# Patient Record
Sex: Female | Born: 1965 | Marital: Married | Smoking: Never smoker
Health system: Southern US, Community
[De-identification: ages and names within clinical notes are randomized; demographics above are authoritative.]

## PROBLEM LIST (undated history)

## (undated) DIAGNOSIS — I7300 Raynaud's syndrome without gangrene: Secondary | ICD-10-CM

## (undated) DIAGNOSIS — M349 Systemic sclerosis, unspecified: Secondary | ICD-10-CM

---

## 2013-03-03 ENCOUNTER — Emergency Department: Payer: Self-pay | Admitting: Emergency Medicine

## 2013-03-03 ENCOUNTER — Ambulatory Visit: Payer: Self-pay | Admitting: Family Medicine

## 2013-03-03 LAB — CBC WITH DIFFERENTIAL/PLATELET
Basophil #: 0.1 10*3/uL (ref 0.0–0.1)
Basophil %: 0.9 %
EOS PCT: 1.4 %
Eosinophil #: 0.1 10*3/uL (ref 0.0–0.7)
HCT: 19.8 % — AB (ref 35.0–47.0)
HGB: 5.7 g/dL — AB (ref 12.0–16.0)
LYMPHS ABS: 1.7 10*3/uL (ref 1.0–3.6)
Lymphocyte %: 21 %
MCH: 18.1 pg — ABNORMAL LOW (ref 26.0–34.0)
MCHC: 29 g/dL — AB (ref 32.0–36.0)
MCV: 63 fL — AB (ref 80–100)
Monocyte #: 1.1 x10 3/mm — ABNORMAL HIGH (ref 0.2–0.9)
Monocyte %: 13 %
Neutrophil #: 5.1 10*3/uL (ref 1.4–6.5)
Neutrophil %: 63.7 %
PLATELETS: 444 10*3/uL — AB (ref 150–440)
RBC: 3.16 10*6/uL — AB (ref 3.80–5.20)
RDW: 19.6 % — AB (ref 11.5–14.5)
WBC: 8.1 10*3/uL (ref 3.6–11.0)

## 2013-03-03 LAB — COMPREHENSIVE METABOLIC PANEL
ALBUMIN: 3.4 g/dL (ref 3.4–5.0)
ANION GAP: 9 (ref 7–16)
Alkaline Phosphatase: 66 U/L
BUN: 11 mg/dL (ref 7–18)
Bilirubin,Total: 0.2 mg/dL (ref 0.2–1.0)
CALCIUM: 9.1 mg/dL (ref 8.5–10.1)
CO2: 26 mmol/L (ref 21–32)
Chloride: 101 mmol/L (ref 98–107)
Creatinine: 0.72 mg/dL (ref 0.60–1.30)
EGFR (African American): >60
EGFR (Non-African Amer.): >60
GLUCOSE: 86 mg/dL (ref 65–99)
Osmolality: 271 (ref 275–301)
Potassium: 4.2 mmol/L (ref 3.5–5.1)
SGOT(AST): 17 U/L (ref 15–37)
SGPT (ALT): 18 U/L (ref 12–78)
Sodium: 136 mmol/L (ref 136–145)
Total Protein: 8.9 g/dL — ABNORMAL HIGH (ref 6.4–8.2)

## 2013-03-03 LAB — PROTIME-INR
INR: 1
INR: 1.1
Prothrombin Time: 13.5 secs (ref 11.5–14.7)
Prothrombin Time: 13.8 secs (ref 11.5–14.7)

## 2013-03-03 LAB — TROPONIN I: TROPONIN-I: 0.03 ng/mL

## 2013-03-03 LAB — APTT: ACTIVATED PTT: 28.3 s (ref 23.6–35.9)

## 2013-03-03 LAB — CK TOTAL AND CKMB (NOT AT ARMC)
CK, TOTAL: 49 U/L (ref 21–215)
CK-MB: <0.5 ng/mL — ABNORMAL LOW (ref 0.5–3.6)

## 2013-03-04 LAB — HEMOGLOBIN: HGB: 8 g/dL — ABNORMAL LOW (ref 12.0–16.0)

## 2013-03-04 LAB — HEMATOCRIT: HCT: 25.3 % — ABNORMAL LOW (ref 35.0–47.0)

## 2015-10-03 ENCOUNTER — Ambulatory Visit
Admission: RE | Admit: 2015-10-03 | Discharge: 2015-10-03 | Disposition: A | Discharge status: Home / Self Care | Payer: BLUE CROSS/BLUE SHIELD | Source: Ambulatory Visit | Attending: Family Medicine | Admitting: Family Medicine

## 2015-10-03 ENCOUNTER — Other Ambulatory Visit: Payer: Self-pay | Admitting: Family Medicine

## 2015-10-03 DIAGNOSIS — R0602 Shortness of breath: Secondary | ICD-10-CM

## 2015-10-03 DIAGNOSIS — J849 Interstitial pulmonary disease, unspecified: Secondary | ICD-10-CM | POA: Diagnosis not present

## 2015-10-05 ENCOUNTER — Other Ambulatory Visit: Payer: Self-pay | Admitting: Specialist

## 2015-10-05 DIAGNOSIS — M349 Systemic sclerosis, unspecified: Secondary | ICD-10-CM

## 2015-10-05 DIAGNOSIS — R05 Cough: Secondary | ICD-10-CM

## 2015-10-05 DIAGNOSIS — R059 Cough, unspecified: Secondary | ICD-10-CM

## 2015-10-05 DIAGNOSIS — J8410 Pulmonary fibrosis, unspecified: Secondary | ICD-10-CM

## 2015-10-19 ENCOUNTER — Ambulatory Visit: Payer: BLUE CROSS/BLUE SHIELD

## 2015-10-21 ENCOUNTER — Encounter: Payer: Self-pay | Admitting: Radiology

## 2015-10-21 ENCOUNTER — Ambulatory Visit
Admission: RE | Admit: 2015-10-21 | Discharge: 2015-10-21 | Disposition: A | Discharge status: Home / Self Care | Payer: BLUE CROSS/BLUE SHIELD | Source: Ambulatory Visit | Attending: Specialist | Admitting: Specialist

## 2015-10-21 DIAGNOSIS — R05 Cough: Secondary | ICD-10-CM

## 2015-10-21 DIAGNOSIS — M3481 Systemic sclerosis with lung involvement: Secondary | ICD-10-CM | POA: Insufficient documentation

## 2015-10-21 DIAGNOSIS — M349 Systemic sclerosis, unspecified: Secondary | ICD-10-CM | POA: Diagnosis present

## 2015-10-21 DIAGNOSIS — J8410 Pulmonary fibrosis, unspecified: Secondary | ICD-10-CM | POA: Insufficient documentation

## 2015-10-21 DIAGNOSIS — R059 Cough, unspecified: Secondary | ICD-10-CM

## 2017-12-23 IMAGING — CR DG CHEST 2V
2 series · 2 of 2 positions shown · non-contrast
Comparison: 03/03/2013.

CLINICAL DATA: Malaise for the past 2 weeks. Shortness of breath.
Dry cough. Fatigue. Bilateral upper and lower chest pain. History of
cystic fibrosis, scleroderma and pneumonia.

EXAM:
CHEST  2 VIEW

[chest pa]
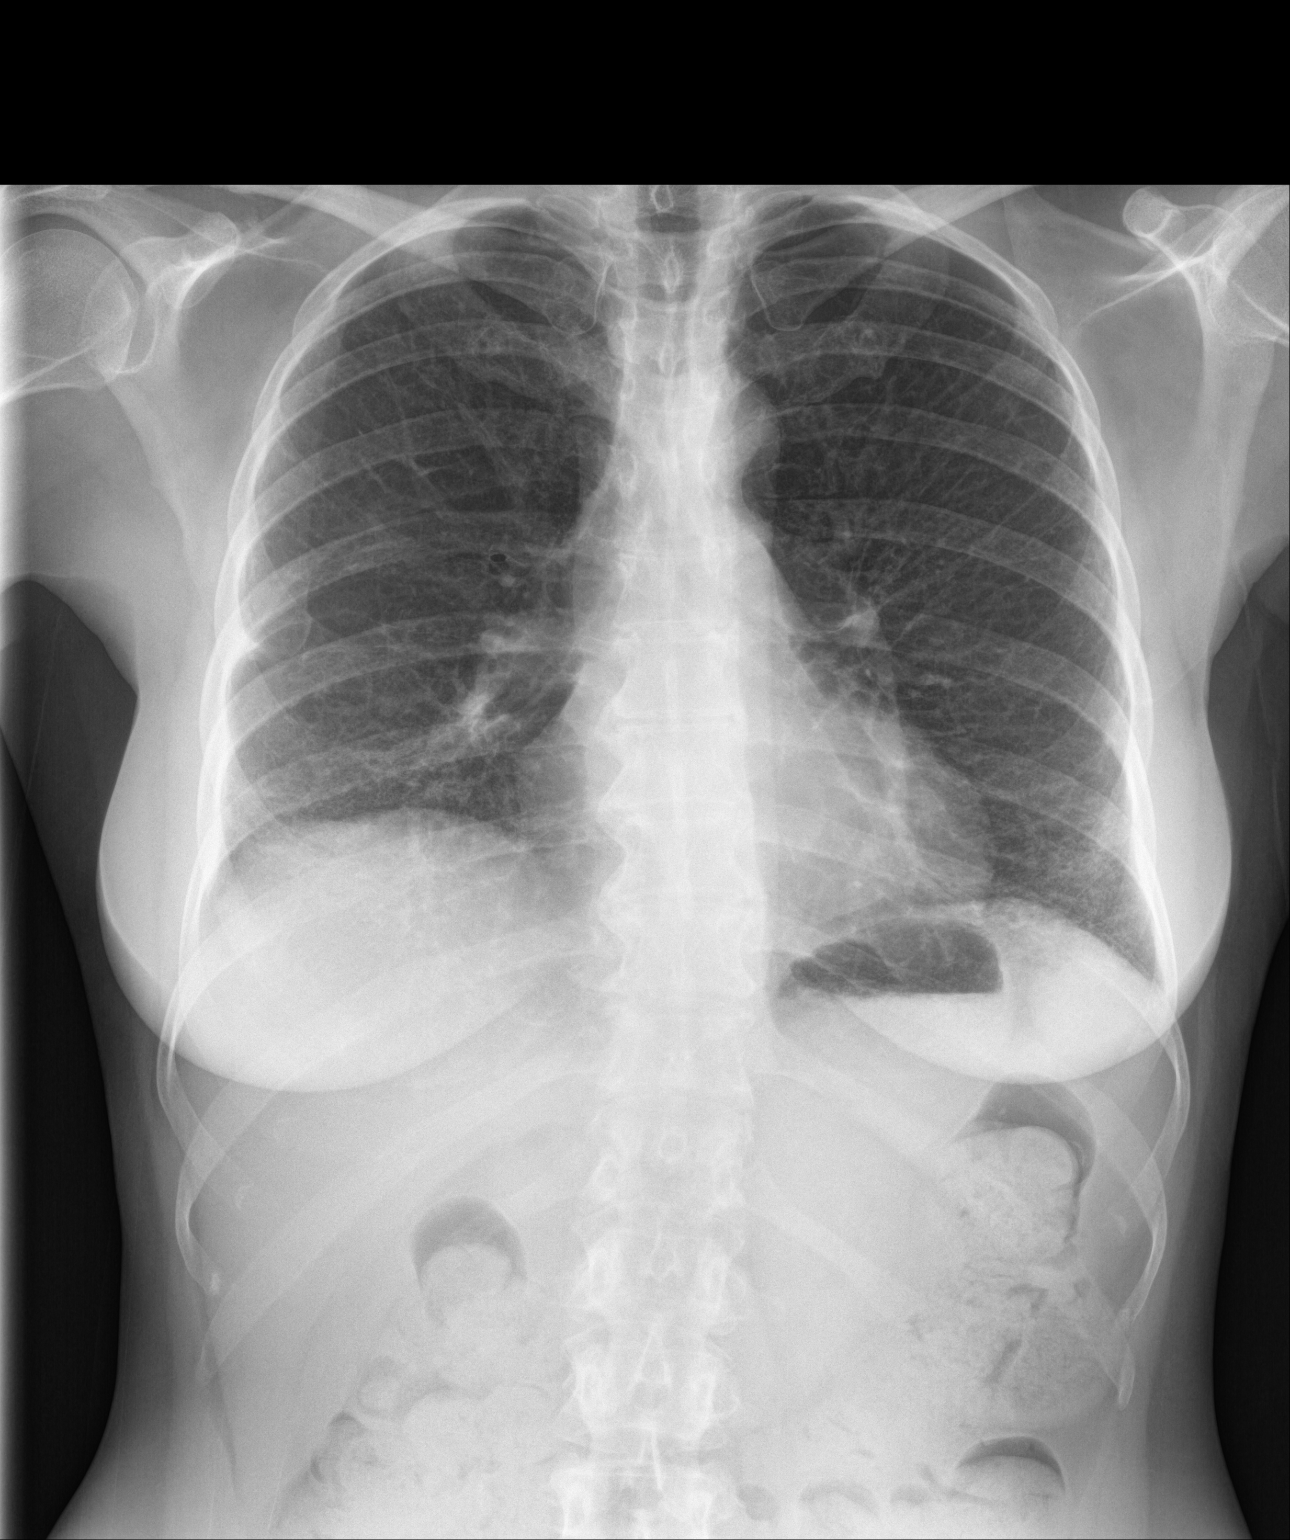

[chest lat]
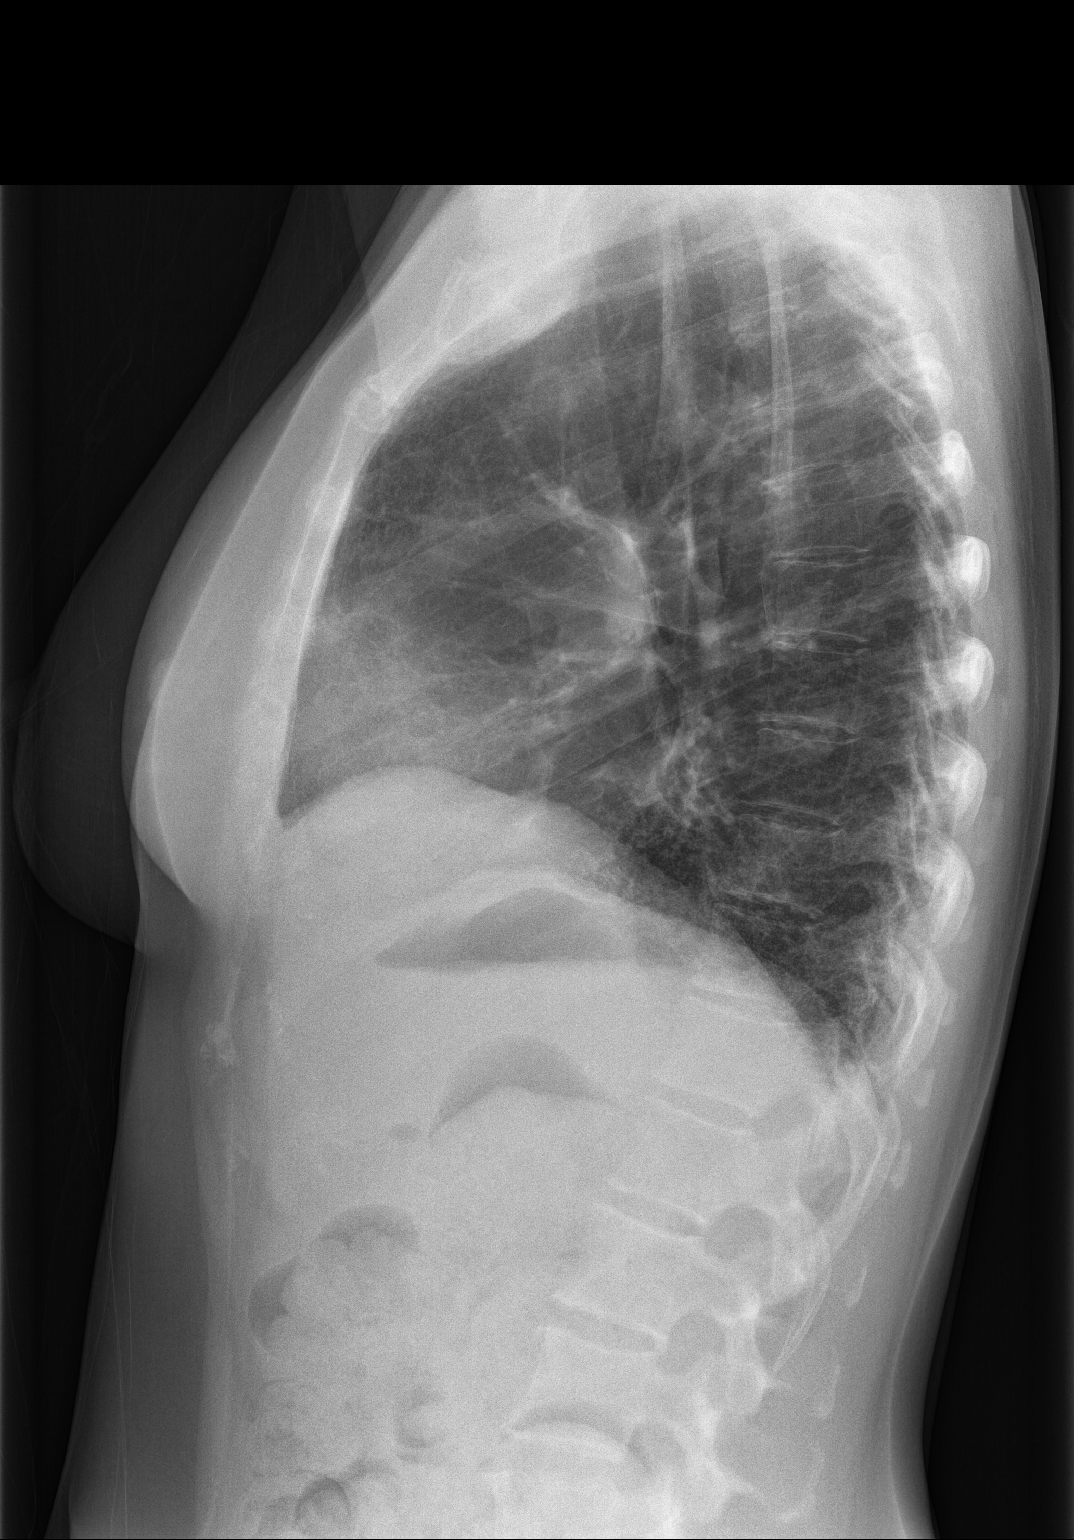

[2 of 2 positions shown; findings below may reference images not displayed]

FINDINGS: Normal sized heart. The interstitial markings remain prominent,
specially at the lung bases. Mild thoracic and lumbar spine
degenerative changes and mild scoliosis.
IMPRESSION: Stable chronic interstitial lung disease.  No acute abnormality.

## 2021-03-29 ENCOUNTER — Ambulatory Visit
Admission: RE | Admit: 2021-03-29 | Payer: BLUE CROSS/BLUE SHIELD | Source: Home / Self Care | Admitting: General Surgery

## 2021-03-29 ENCOUNTER — Encounter: Admission: RE | Payer: Self-pay | Source: Home / Self Care

## 2021-03-29 SURGERY — COLONOSCOPY WITH PROPOFOL
Anesthesia: General

## 2022-07-07 ENCOUNTER — Encounter: Payer: Self-pay | Admitting: Emergency Medicine

## 2022-07-07 ENCOUNTER — Other Ambulatory Visit: Payer: Self-pay

## 2022-07-07 ENCOUNTER — Inpatient Hospital Stay
Admission: EM | Admit: 2022-07-07 | Discharge: 2022-07-17 | DRG: 392 | Disposition: A | Discharge status: Home / Self Care | Payer: BC Managed Care – PPO | Attending: Internal Medicine | Admitting: Internal Medicine

## 2022-07-07 ENCOUNTER — Emergency Department: Payer: BC Managed Care – PPO

## 2022-07-07 DIAGNOSIS — R109 Unspecified abdominal pain: Principal | ICD-10-CM

## 2022-07-07 DIAGNOSIS — J8410 Pulmonary fibrosis, unspecified: Secondary | ICD-10-CM | POA: Diagnosis present

## 2022-07-07 DIAGNOSIS — Z6821 Body mass index (BMI) 21.0-21.9, adult: Secondary | ICD-10-CM | POA: Diagnosis not present

## 2022-07-07 DIAGNOSIS — M349 Systemic sclerosis, unspecified: Secondary | ICD-10-CM

## 2022-07-07 DIAGNOSIS — Z79899 Other long term (current) drug therapy: Secondary | ICD-10-CM

## 2022-07-07 DIAGNOSIS — E440 Moderate protein-calorie malnutrition: Secondary | ICD-10-CM | POA: Diagnosis present

## 2022-07-07 DIAGNOSIS — K5900 Constipation, unspecified: Principal | ICD-10-CM | POA: Diagnosis present

## 2022-07-07 DIAGNOSIS — K219 Gastro-esophageal reflux disease without esophagitis: Secondary | ICD-10-CM | POA: Diagnosis present

## 2022-07-07 DIAGNOSIS — I7300 Raynaud's syndrome without gangrene: Secondary | ICD-10-CM | POA: Diagnosis present

## 2022-07-07 DIAGNOSIS — K5902 Outlet dysfunction constipation: Secondary | ICD-10-CM | POA: Diagnosis not present

## 2022-07-07 HISTORY — DX: Raynaud's syndrome without gangrene: I73.00

## 2022-07-07 HISTORY — DX: Systemic sclerosis, unspecified: M34.9

## 2022-07-07 LAB — LIPASE, BLOOD: Lipase: 33 U/L (ref 11–51)

## 2022-07-07 LAB — COMPREHENSIVE METABOLIC PANEL
ALT: 13 U/L (ref 0–44)
AST: 22 U/L (ref 15–41)
Albumin: 3.6 g/dL (ref 3.5–5.0)
Alkaline Phosphatase: 51 U/L (ref 38–126)
Anion gap: 10 (ref 5–15)
BUN: 15 mg/dL (ref 6–20)
CO2: 25 mmol/L (ref 22–32)
Calcium: 9.1 mg/dL (ref 8.9–10.3)
Chloride: 97 mmol/L — ABNORMAL LOW (ref 98–111)
Creatinine, Ser: 0.59 mg/dL (ref 0.44–1.00)
GFR, Estimated: >60 mL/min (ref >60)
Glucose, Bld: 103 mg/dL — ABNORMAL HIGH (ref 70–99)
Potassium: 4.4 mmol/L (ref 3.5–5.1)
Sodium: 132 mmol/L — ABNORMAL LOW (ref 135–145)
Total Bilirubin: 0.5 mg/dL (ref 0.3–1.2)
Total Protein: 8.7 g/dL — ABNORMAL HIGH (ref 6.5–8.1)

## 2022-07-07 LAB — CBC
HCT: 44.6 % (ref 36.0–46.0)
Hemoglobin: 13.5 g/dL (ref 12.0–15.0)
MCH: 25.9 pg — ABNORMAL LOW (ref 26.0–34.0)
MCHC: 30.3 g/dL (ref 30.0–36.0)
MCV: 85.6 fL (ref 80.0–100.0)
Platelets: 454 10*3/uL — ABNORMAL HIGH (ref 150–400)
RBC: 5.21 MIL/uL — ABNORMAL HIGH (ref 3.87–5.11)
RDW: 15.6 % — ABNORMAL HIGH (ref 11.5–15.5)
WBC: 9.5 10*3/uL (ref 4.0–10.5)
nRBC: 0 % (ref 0.0–0.2)

## 2022-07-07 LAB — URINALYSIS, ROUTINE W REFLEX MICROSCOPIC
Bacteria, UA: NONE SEEN
Bilirubin Urine: NEGATIVE
Glucose, UA: NEGATIVE mg/dL
Ketones, ur: 40 mg/dL — AB
Leukocytes,Ua: NEGATIVE
Nitrite: NEGATIVE
Protein, ur: NEGATIVE mg/dL
Specific Gravity, Urine: <1.005 — ABNORMAL LOW (ref 1.005–1.030)
pH: 5.5 (ref 5.0–8.0)

## 2022-07-07 LAB — PREGNANCY, URINE: Preg Test, Ur: NEGATIVE

## 2022-07-07 MED ORDER — FLEET ENEMA 7-19 GM/118ML RE ENEM
1.0000 | ENEMA | Freq: Two times a day (BID) | RECTAL | Status: DC
  Administered 2022-07-07 – 2022-07-15 (×15): 1 via RECTAL

## 2022-07-07 MED ORDER — SODIUM CHLORIDE 0.9 % IV BOLUS
1000.0000 mL | Freq: Once | INTRAVENOUS | Status: AC
  Administered 2022-07-07: 1000 mL via INTRAVENOUS

## 2022-07-07 MED ORDER — GLYCERIN (LAXATIVE) 2 G RE SUPP
1.0000 | Freq: Every day | RECTAL | Status: DC
  Administered 2022-07-07 – 2022-07-11 (×5): 1 via RECTAL
  Filled 2022-07-07 (×5): qty 1

## 2022-07-07 MED ORDER — IOHEXOL 300 MG/ML  SOLN
100.0000 mL | Freq: Once | INTRAMUSCULAR | Status: AC | PRN
  Administered 2022-07-07: 80 mL via INTRAVENOUS

## 2022-07-07 MED ORDER — ONDANSETRON HCL 4 MG PO TABS: 4.0000 mg | ORAL_TABLET | ORAL | Status: DC | PRN

## 2022-07-07 MED ORDER — PANTOPRAZOLE SODIUM 40 MG IV SOLR
40.0000 mg | INTRAVENOUS | Status: DC
  Administered 2022-07-07 – 2022-07-08 (×2): 40 mg via INTRAVENOUS
  Filled 2022-07-07 (×2): qty 10

## 2022-07-07 MED ORDER — SODIUM CHLORIDE 0.9 % IV SOLN: INTRAVENOUS | Status: DC

## 2022-07-07 MED ORDER — MINERAL OIL RE ENEM
1.0000 | ENEMA | Freq: Every day | RECTAL | Status: DC
  Administered 2022-07-07 – 2022-07-10 (×4): 1 via RECTAL

## 2022-07-07 MED ORDER — ENOXAPARIN SODIUM 40 MG/0.4ML IJ SOSY
40.0000 mg | PREFILLED_SYRINGE | INTRAMUSCULAR | Status: DC
  Administered 2022-07-07 – 2022-07-15 (×8): 40 mg via SUBCUTANEOUS
  Filled 2022-07-07 (×7): qty 0.4

## 2022-07-07 MED ORDER — ONDANSETRON HCL 4 MG/2ML IJ SOLN: 4.0000 mg | Freq: Four times a day (QID) | INTRAMUSCULAR | Status: DC | PRN

## 2022-07-07 MED ORDER — PEG 3350-KCL-NA BICARB-NACL 420 G PO SOLR
4000.0000 mL | Freq: Once | ORAL | Status: AC
  Administered 2022-07-07: 4000 mL via ORAL
  Filled 2022-07-07: qty 4000

## 2022-07-07 MED ORDER — ONDANSETRON HCL 4 MG PO TABS: 4.0000 mg | ORAL_TABLET | Freq: Four times a day (QID) | ORAL | Status: DC | PRN

## 2022-07-07 MED ORDER — GLYCERIN (LAXATIVE) 1 G RE SUPP: 1.0000 | Freq: Every day | RECTAL | Status: DC

## 2022-07-07 MED ORDER — BISACODYL 10 MG RE SUPP
10.0000 mg | Freq: Every day | RECTAL | Status: DC
  Administered 2022-07-08 – 2022-07-11 (×4): 10 mg via RECTAL
  Filled 2022-07-07 (×5): qty 1

## 2022-07-07 MED ORDER — ONDANSETRON HCL 4 MG/2ML IJ SOLN
4.0000 mg | Freq: Four times a day (QID) | INTRAMUSCULAR | Status: DC | PRN
  Administered 2022-07-08 – 2022-07-13 (×3): 4 mg via INTRAVENOUS
  Filled 2022-07-07 (×3): qty 2

## 2022-07-07 NOTE — H&P (Addendum)
History and Physical    Patient: Jessica Stephenson ZOX:[id number] DOB: 1965/09/01 DOA: 07/07/2022 DOS: the patient was seen and examined on 07/07/2022 PCP: Dione Housekeeper, MD  Patient coming from: Home  Chief Complaint:  Chief Complaint  Patient presents with   Abdominal Pain   Nausea   HPI: Jessica Stephenson is a 57 y.o. female with medical history significant of scleroderma, GERD, pulmonary fibrosis, Raynaud's phenomenon presenting with severe constipation.  Patient with a baseline history of scleroderma.  Followed by [facility] rheumatology.  Patient ports recurring history of abdominal pain and constipation in the past.  No admission back in 2019 in [facility] system for similar issues.  Patient reports being started on Myfortic in addition to Plaquenil for scleroderma.  Patient reports significant worsening constipation since starting the medication.  Was using laxatives with minimal improvement in symptoms.  Has not had a significant bowel movement in multiple weeks.  Positive inability tolerate p.o. intake.  Has stopped taking medications entirely.  No chest pain.  Significant abdominal distention and bloating.  No shortness of breath.  No wheezing. Presented to the ER afebrile, heart rate 100s, BP stable, satting well on room air.  White count 9.5, hemoglobin 13.5, platelets 454, creatinine 0.6.  CT abdomen pelvis with lower esophageal circumferential wall thickening consistent with scleroderma as well as marked constipation throughout the colon in the setting of dysmotility and chronic interstitial lung disease. Review of Systems: As mentioned in the history of present illness. All other systems reviewed and are negative. Past Medical History:  Diagnosis Date   Raynaud disease    Scleroderma (HCC)   Social History:  has no history on file for tobacco use, alcohol use, and drug use.  Not on File  No family history on file.  Prior to Admission medications   Medication  Sig Start Date End Date Taking? Authorizing Provider  lansoprazole (PREVACID) 30 MG capsule Take 30 mg by mouth 2 (two) times daily before a meal. 01/23/22 07/22/22 Yes [provider]    Physical Exam: Vitals:   07/07/22 1100 07/07/22 1115 07/07/22 1130 07/07/22 1145  BP:      Pulse: 93 97 (!) 112 (!) 102  Resp:      Temp:      TempSrc:      SpO2: 95% 98% 100% 97%  Weight:      Height:       Physical Exam Constitutional:      Comments: Underweight  HENT:     Head: Normocephalic and atraumatic.     Mouth/Throat:     Mouth: Mucous membranes are dry.  Eyes:     Pupils: Pupils are equal, round, and reactive to light.  Cardiovascular:     Rate and Rhythm: Normal rate and regular rhythm.  Pulmonary:     Effort: Pulmonary effort is normal.  Abdominal:     Comments: Positive generalized abdominal distention Mild decreased bowel sounds  Musculoskeletal:        General: Normal range of motion.     Cervical back: Normal range of motion.  Skin:    General: Skin is dry.  Neurological:     General: No focal deficit present.  Psychiatric:        Mood and Affect: Mood normal.     Data Reviewed:  There are no new results to review at this time. CT ABDOMEN PELVIS W CONTRAST CLINICAL DATA:  Bowel obstruction suspected. Reported clinical history of scleroderma.  EXAM: CT ABDOMEN  AND PELVIS WITH CONTRAST  TECHNIQUE: Multidetector CT imaging of the abdomen and pelvis was performed using the standard protocol following bolus administration of intravenous contrast.  RADIATION DOSE REDUCTION: This exam was performed according to the departmental dose-optimization program which includes automated exposure control, adjustment of the mA and/or kV according to patient size and/or use of iterative reconstruction technique.  CONTRAST:  80mL OMNIPAQUE IOHEXOL 300 MG/ML  SOLN  COMPARISON:  None Available.  FINDINGS: Lower chest: Bronchiectasis with chronic peripheral  interstitial lung disease noted in both lung bases. Features are similar to minimally progressive since CT chest of 10/21/2015.  Hepatobiliary: No suspicious focal abnormality within the liver parenchyma. There is no evidence for gallstones, gallbladder wall thickening, or pericholecystic fluid. No intrahepatic or extrahepatic biliary dilation.  Pancreas: No focal mass lesion. No dilatation of the main duct. No intraparenchymal cyst. No peripancreatic edema.  Spleen: No splenomegaly. No focal mass lesion.  Adrenals/Urinary Tract: No adrenal nodule or mass. Kidneys unremarkable. No evidence for hydroureter. The urinary bladder appears normal for the degree of distention.  Stomach/Bowel: Distal esophagus is patulous with circumferential wall thickening. Stomach is unremarkable. No gastric wall thickening. No evidence of outlet obstruction. Duodenum is normally positioned as is the ligament of Treitz. Small bowel loops in the abdomen and pelvis are fluid-filled and distended measuring on the order of 2.523.4 cm diameter. The cecum is low in the pelvis. Neither the terminal ileum nor the appendix can be discretely visualized due to the prominent diffuse small bowel and colonic distension. Cecum is dilated up to 7.2 cm diameter. Ascending colon and transverse colon are packed with fluid and stool with transverse colon measuring on the order of 4.9 cm diameter. Formed stool is visible in the splenic flexure, descending colon, and within the long redundant sigmoid colon. The distal sigmoid colon and rectum is largely free of stool. Colon is noted to be diffusely featureless with absence of haustra folds. No evidence for pneumatosis. No portal venous gas.  Vascular/Lymphatic: No abdominal aortic aneurysm. No abdominal aortic atherosclerotic calcification. Portal vein, superior mesenteric vein, and splenic vein are patent. There is no gastrohepatic or hepatoduodenal ligament  lymphadenopathy. No retroperitoneal or mesenteric lymphadenopathy. No pelvic sidewall lymphadenopathy.  Reproductive: The uterus is unremarkable.  There is no adnexal mass.  Other: No intraperitoneal free fluid.  Musculoskeletal: No worrisome lytic or sclerotic osseous abnormality.  IMPRESSION: 1. Diffusely patulous lower esophagus with circumferential wall thickening. Imaging features compatible with the reported clinical history of scleroderma. 2. There is diffuse fluid-filled small bowel distension without appreciable wall thickening or pneumatosis. Colon is markedly distended throughout with formed stool seen in the splenic flexure and extending to the level of the distal sigmoid segment. Colon is also noted to be featureless without evidence for hospital fold markings. No wall thickening or pneumatosis in the colon. No distal obstructing colonic mass lesion evident by CT. General appearance is compatible with marked constipation and may be related to underlying dysmotility. 3. Features of underlying chronic interstitial lung disease.  Electronically Signed   By: Kennith Center M.D.   On: 07/07/2022 10:29  Lab Results  Component Value Date   WBC 9.5 07/07/2022   HGB 13.5 07/07/2022   HCT 44.6 07/07/2022   MCV 85.6 07/07/2022   PLT 454 (H) 07/07/2022   Last metabolic panel Lab Results  Component Value Date   GLUCOSE 103 (H) 07/07/2022   NA 132 (L) 07/07/2022   K 4.4 07/07/2022   CL 97 (L) 07/07/2022  CO2 25 07/07/2022   BUN 15 07/07/2022   CREATININE 0.59 07/07/2022   GFRNONAA >60 07/07/2022   CALCIUM 9.1 07/07/2022   PROT 8.7 (H) 07/07/2022   ALBUMIN 3.6 07/07/2022   BILITOT 0.5 07/07/2022   ALKPHOS 51 07/07/2022   AST 22 07/07/2022   ALT 13 07/07/2022   ANIONGAP 10 07/07/2022    Assessment and Plan: * Constipation Worsening constipation in setting of baseline constipation w/ scleroderma assd dysmotility  Noted marked constipation w/ dysmotility on CT   Per Dr. Larinda Buttery, case discussed w/ Dr. Mia Creek Will start w/ lower bowel regimen w/ enemas Follow up recommendations w/ gastroenterology   GERD (gastroesophageal reflux disease) PPI  Pulmonary fibrosis (HCC) Baseline pulmonary fibrosis in the setting of scleroderma Stable from respiratory standpoint Follow  Scleroderma (HCC) Baseline scleroderma previous on Plaquenil and Myfortic Followed by [facility] in the setting of severe constipation Monitor for now       Advance Care Planning:   Code Status: Full Code   Consults: Dr. Mia Creek w/ gastroenterology   Family Communication: Family at the bedside   Severity of Illness: The appropriate patient status for this patient is INPATIENT. Inpatient status is judged to be reasonable and necessary in order to provide the required intensity of service to ensure the patient's safety. The patient's presenting symptoms, physical exam findings, and initial radiographic and laboratory data in the context of their chronic comorbidities is felt to place them at high risk for further clinical deterioration. Furthermore, it is not anticipated that the patient will be medically stable for discharge from the hospital within 2 midnights of admission.   * I certify that at the point of admission it is my clinical judgment that the patient will require inpatient hospital care spanning beyond 2 midnights from the point of admission due to high intensity of service, high risk for further deterioration and high frequency of surveillance required.*  Author: Floydene Flock, MD 07/07/2022 12:48 PM  For on call review [email or web address].

## 2022-07-07 NOTE — ED Provider Notes (Signed)
[facility] Provider Note    Event Date/Time   First MD Initiated Contact with Patient 07/07/22 [id number]     (approximate)  History   Chief Complaint: Abdominal Pain and Nausea  HPI  Jessica Stephenson is a 57 y.o. female with a past medical history of scleroderma, Raynaud's, presents to the emergency department for constipation nausea and abdominal distention.  According to the patient in November she was started on a new medication for her scleroderma.  Since that time she states she has been experiencing episodes initially of diarrhea now with constipation.  Patient states she has tried laxatives at home with very very small bowel movements per patient.  States she is nauseated having difficulty eating which she relates to the abdominal distention.  States it has been approximately 6 weeks since she had a "normal" bowel movement.  Patient denies any pain in the abdomen just that it feels tight and distended.  Occasional vomiting after eating per patient.  Physical Exam   Triage Vital Signs: ED Triage Vitals  Enc Vitals Group     BP 07/07/22 0802 (P) 106/82     Pulse Rate 07/07/22 0802 (!) (P) 142     Resp 07/07/22 0802 (P) 20     Temp 07/07/22 0800 98.2 F (36.8 C)     Temp Source 07/07/22 0800 Oral     SpO2 07/07/22 0802 (P) 100 %     Weight 07/07/22 0800 115 lb (52.2 kg)     Height 07/07/22 0800 5\' 2"  (1.575 m)     Head Circumference --      Peak Flow --      Pain Score 07/07/22 0800 6     Pain Loc --      Pain Edu? --      Excl. in GC? --     Most recent vital signs: Vitals:   07/07/22 0825 07/07/22 0826  BP:    Pulse: (!) 108 (!) 114  Resp:    Temp:    SpO2: 99% 99%    General: Awake, no distress.  Chronically ill-appearing/cachectic. CV:  Good peripheral perfusion.  Regular rate and rhythm around 100 bpm. Resp:  Normal effort.  Equal breath sounds bilaterally.  Abd:  No distention.  Moderate abdominal distention although  nontender to palpation.  Dull percussion.  ED Results / Procedures / Treatments   EKG  EKG viewed and interpreted by myself shows sinus tachycardia 130 bpm with a narrow QRS, normal axis, normal intervals, nonspecific ST changes without ST elevation.  RADIOLOGY  I have reviewed and interpreted the CT images.  Patient has significant constipation with obstructive characteristics with fluid-filled small bowel all the way up to the stomach. Radiology is read the CT scan as wall thickening of the distal esophagus, fluid-filled small bowel loops with significant constipation.   MEDICATIONS ORDERED IN ED: Medications - No data to display   IMPRESSION / MDM / ASSESSMENT AND PLAN / ED COURSE  I reviewed the triage vital signs and the nursing notes.  Patient's presentation is most consistent with acute presentation with potential threat to life or bodily function.  Patient presents emergency department for constipation nausea and abdominal distention.  I reviewed the patient's chart from [facility] she has scleroderma patient recently started on Myfortic, which could be the cause of the patient's GI issues.  She was referred to GI, states she is currently attempting to see Dr. Tobi Bastos of GI but has been unsuccessful so far  and does not have a current GI physician.  Patient states she has tried laxatives at home she has tried an enema at home without relief so she came to the emergency department.  Here the patient does appear chronically ill/cachectic but with a distended abdomen nontender but dull percussion.  Given these findings we will proceed with labs to evaluate for metabolic/electrolyte abnormalities, obtain CT imaging the abdomen/pelvis and continue to closely monitor.  Patient agreeable to plan of care.  CT scan shows very significant constipation with fluid-filled small bowel loops all the way to the stomach, concerning for very significant constipation, and decreased motility.  Reassuringly  patient's CBC is normal, chemistry shows no significant findings and lipase is normal.  Given the CT findings and patient's abdominal distention and discomfort I spoke to Dr. Mia Creek of GI who recommends enemas until the patient is able to tolerate something orally.  Will admit to the hospitalist.  FINAL CLINICAL IMPRESSION(S) / ED DIAGNOSES   Abdominal distention Constipation  Note:  This document was prepared using Dragon voice recognition software and may include unintentional dictation errors.   Minna Antis, MD 07/07/22 1130

## 2022-07-07 NOTE — Assessment & Plan Note (Signed)
Baseline scleroderma previous on Plaquenil and Myfortic Followed by [facility] rheumatology [facility] in the setting of severe constipation Monitor for now

## 2022-07-07 NOTE — Assessment & Plan Note (Addendum)
Worsening constipation in setting of baseline constipation  Noted marked constipation w/ dysmotility on CT  Per Dr. Larinda Buttery, case discussed w/ Dr. Mia Creek Will start w/ lower bowel regimen w/ enemas Follow up recommendations w/ gastroenterology

## 2022-07-07 NOTE — Assessment & Plan Note (Signed)
Baseline pulmonary fibrosis in the setting of scleroderma Stable from respiratory standpoint Follow

## 2022-07-07 NOTE — Assessment & Plan Note (Signed)
PPI ?

## 2022-07-07 NOTE — Consult Note (Signed)
Consultation  Referring Provider: ED      Admit date: 07/07/2022 Consult date: 07/07/2022         Reason for Consultation: Constipation              HPI:   Jessica Stephenson is a 57 y.o. lady with history of scleroderma, pulmonary fibrosis, and raynaud's who is here for severe constipation as evidenced by stool on CT. Has had a similar episode a few years ago. Patient attributes it to her myfortic but on brief chart review it seems she has a long history of constipation likely due to scleroderma. Her last good bowel movement was over a month ago. She has had poor PO intake. Has nausea even with liquids. She tried some over the counter enemas without success.    Past Medical History:  Diagnosis Date   Raynaud disease    Scleroderma (HCC)      No family history on file.  Family history of alcohol abuse in her father     Prior to Admission medications   Medication Sig Start Date End Date Taking? Authorizing Provider  hydroxychloroquine (PLAQUENIL) 200 MG tablet Take 200 mg by mouth daily. 01/23/22  Yes [provider]  lansoprazole (PREVACID) 30 MG capsule Take 30 mg by mouth 2 (two) times daily before a meal. 01/23/22 07/22/22 Yes [provider]  meloxicam (MOBIC) 15 MG tablet Take 15 mg by mouth daily as needed for pain. 01/23/22  Yes [provider]  mycophenolate (MYFORTIC) 360 MG TBEC EC tablet Take 720 mg by mouth 2 (two) times daily.   Yes [provider]  sildenafil (REVATIO) 20 MG tablet Take 20 mg by mouth 2 (two) times daily. 01/23/22 01/23/23 Yes [provider]    Current Facility-Administered Medications  Medication Dose Route Frequency Provider Last Rate Last Admin   0.9 %  sodium chloride infusion   Intravenous Continuous Floydene Flock, MD 100 mL/hr at 07/07/22 1452 New Bag at 07/07/22 1452   [START ON 07/08/2022] bisacodyl (DULCOLAX) suppository 10 mg  10 mg Rectal Daily Regis Bill, MD       enoxaparin  (LOVENOX) injection 40 mg  40 mg Subcutaneous Q24H Floydene Flock, MD   40 mg at 07/07/22 1452   glycerin (Pediatric) 1 g suppository 1 g  1 suppository Rectal Daily Regis Bill, MD       mineral oil enema 1 enema  1 enema Rectal Daily Floydene Flock, MD   1 enema at 07/07/22 1454   ondansetron (ZOFRAN) tablet 4 mg  4 mg Oral Q4H PRN Regis Bill, MD       Or   ondansetron (ZOFRAN) injection 4 mg  4 mg Intravenous Q6H PRN Regis Bill, MD       pantoprazole (PROTONIX) injection 40 mg  40 mg Intravenous Q24H Jayd Forrey, Rossie Muskrat, MD       polyethylene glycol-electrolytes (NuLYTELY) solution 4,000 mL  4,000 mL Oral Once Regis Bill, MD       sodium phosphate (FLEET) 7-19 GM/118ML enema 1 enema  1 enema Rectal BID Floydene Flock, MD   1 enema at 07/07/22 1512    Allergies as of 07/07/2022   (No Known Allergies)     Review of Systems:    All systems reviewed and negative except where noted in HPI.  Review of Systems  Constitutional:  Negative for chills and fever.  Respiratory:  Positive for shortness of breath.  Cardiovascular:  Negative for chest pain.  Gastrointestinal:  Positive for abdominal pain, constipation, nausea and vomiting.  Musculoskeletal:  Positive for joint pain.  Skin:  Negative for rash.  Neurological:  Negative for focal weakness.  Psychiatric/Behavioral:  Negative for substance abuse.   All other systems reviewed and are negative.      Physical Exam:  Vital signs in last 24 hours: Temp:  [97.8 F (36.6 C)-98.4 F (36.9 C)] 97.8 F (36.6 C) (05/11 1723) Pulse Rate:  [43-142] 43 (05/11 1723) Resp:  [18-20] 18 (05/11 1723) BP: (106-124)/(69-77) 121/71 (05/11 1723) SpO2:  [62 %-100 %] 62 % (05/11 1723) Weight:  [52.2 kg] 52.2 kg (05/11 0800)   General:   Cachetic appearing Head:  Normocephalic and atraumatic. Eyes:   No icterus.   Conjunctiva pink. Mouth: Dry mucous membranes Neck:  Supple; no masses felt Lungs:  No  respiratory distress Heart:  S1S2, RRR, no MRG. No edema. Abdomen:   very distended Msk:  tight skin Neurologic:  Alert and  oriented x4;  no focal deficits Skin:  Warm, dry, pink without significant lesions or rashes. Psych:  Alert and cooperative. Normal affect.  LAB RESULTS: Recent Labs    07/07/22 0905  WBC 9.5  HGB 13.5  HCT 44.6  PLT 454*   BMET Recent Labs    07/07/22 0905  NA 132*  K 4.4  CL 97*  CO2 25  GLUCOSE 103*  BUN 15  CREATININE 0.59  CALCIUM 9.1   LFT Recent Labs    07/07/22 0905  PROT 8.7*  ALBUMIN 3.6  AST 22  ALT 13  ALKPHOS 51  BILITOT 0.5   PT/INR No results for input(s): "LABPROT", "INR" in the last 72 hours.  STUDIES: CT ABDOMEN PELVIS W CONTRAST  Result Date: 07/07/2022 CLINICAL DATA:  Bowel obstruction suspected. Reported clinical history of scleroderma. EXAM: CT ABDOMEN AND PELVIS WITH CONTRAST TECHNIQUE: Multidetector CT imaging of the abdomen and pelvis was performed using the standard protocol following bolus administration of intravenous contrast. RADIATION DOSE REDUCTION: This exam was performed according to the departmental dose-optimization program which includes automated exposure control, adjustment of the mA and/or kV according to patient size and/or use of iterative reconstruction technique. CONTRAST:  80mL OMNIPAQUE IOHEXOL 300 MG/ML  SOLN COMPARISON:  None Available. FINDINGS: Lower chest: Bronchiectasis with chronic peripheral interstitial lung disease noted in both lung bases. Features are similar to minimally progressive since CT chest of 10/21/2015. Hepatobiliary: No suspicious focal abnormality within the liver parenchyma. There is no evidence for gallstones, gallbladder wall thickening, or pericholecystic fluid. No intrahepatic or extrahepatic biliary dilation. Pancreas: No focal mass lesion. No dilatation of the main duct. No intraparenchymal cyst. No peripancreatic edema. Spleen: No splenomegaly. No focal mass lesion.  Adrenals/Urinary Tract: No adrenal nodule or mass. Kidneys unremarkable. No evidence for hydroureter. The urinary bladder appears normal for the degree of distention. Stomach/Bowel: Distal esophagus is patulous with circumferential wall thickening. Stomach is unremarkable. No gastric wall thickening. No evidence of outlet obstruction. Duodenum is normally positioned as is the ligament of Treitz. Small bowel loops in the abdomen and pelvis are fluid-filled and distended measuring on the order of 2.523.4 cm diameter. The cecum is low in the pelvis. Neither the terminal ileum nor the appendix can be discretely visualized due to the prominent diffuse small bowel and colonic distension. Cecum is dilated up to 7.2 cm diameter. Ascending colon and transverse colon are packed with fluid and stool with transverse colon measuring on the order  of 4.9 cm diameter. Formed stool is visible in the splenic flexure, descending colon, and within the long redundant sigmoid colon. The distal sigmoid colon and rectum is largely free of stool. Colon is noted to be diffusely featureless with absence of haustra folds. No evidence for pneumatosis. No portal venous gas. Vascular/Lymphatic: No abdominal aortic aneurysm. No abdominal aortic atherosclerotic calcification. Portal vein, superior mesenteric vein, and splenic vein are patent. There is no gastrohepatic or hepatoduodenal ligament lymphadenopathy. No retroperitoneal or mesenteric lymphadenopathy. No pelvic sidewall lymphadenopathy. Reproductive: The uterus is unremarkable.  There is no adnexal mass. Other: No intraperitoneal free fluid. Musculoskeletal: No worrisome lytic or sclerotic osseous abnormality. IMPRESSION: 1. Diffusely patulous lower esophagus with circumferential wall thickening. Imaging features compatible with the reported clinical history of scleroderma. 2. There is diffuse fluid-filled small bowel distension without appreciable wall thickening or pneumatosis. Colon is  markedly distended throughout with formed stool seen in the splenic flexure and extending to the level of the distal sigmoid segment. Colon is also noted to be featureless without evidence for hospital fold markings. No wall thickening or pneumatosis in the colon. No distal obstructing colonic mass lesion evident by CT. General appearance is compatible with marked constipation and may be related to underlying dysmotility. 3. Features of underlying chronic interstitial lung disease. Electronically Signed   By: Kennith Center M.D.   On: 07/07/2022 10:29       Impression / Plan:   57 y/o lady with history of scleroderma, pulmonary fibrosis, and raynaud's who is here for severe constipation as evidenced by stool on CT  - will need to focus on enema's and suppositories from below until things are moving somewhat - will order golytely and instructed her to only takes a few sips as she can tolerate it, if she feels she has too much nausea then she does not need to drink it - increased her fluids a bit - ordered IV PPI given severe GERD likely from scleroderma - will continue to follow, please call with any questions or concerns  Merlyn Lot MD, MPH [facility]

## 2022-07-07 NOTE — Assessment & Plan Note (Signed)
Worsening constipation in setting of baseline constipation w/ scleroderma associated dysmotility  Noted marked constipation w/ dysmotility on CT  GI was consulted and they were recommending lower bowel regimen with enemas and try taking GoLytely if able to tolerate-patient has an history of developing nausea with that in the past.  Still no success -Continue with your bowel regimen with enemas and encourage to take GoLytely. -Might need physical disimpaction

## 2022-07-07 NOTE — ED Notes (Addendum)
EDP into room, at BS. Pt alert, NAD, calm, interactive, resps e/u.  ?

## 2022-07-08 ENCOUNTER — Encounter: Payer: Self-pay | Admitting: Family Medicine

## 2022-07-08 DIAGNOSIS — M349 Systemic sclerosis, unspecified: Secondary | ICD-10-CM | POA: Diagnosis not present

## 2022-07-08 DIAGNOSIS — K5900 Constipation, unspecified: Secondary | ICD-10-CM | POA: Diagnosis not present

## 2022-07-08 DIAGNOSIS — K219 Gastro-esophageal reflux disease without esophagitis: Secondary | ICD-10-CM | POA: Diagnosis not present

## 2022-07-08 DIAGNOSIS — J8410 Pulmonary fibrosis, unspecified: Secondary | ICD-10-CM | POA: Diagnosis not present

## 2022-07-08 LAB — HIV ANTIBODY (ROUTINE TESTING W REFLEX): HIV Screen 4th Generation wRfx: NONREACTIVE

## 2022-07-08 LAB — CBC
HCT: 33.1 % — ABNORMAL LOW (ref 36.0–46.0)
Hemoglobin: 10.2 g/dL — ABNORMAL LOW (ref 12.0–15.0)
MCH: 26.2 pg (ref 26.0–34.0)
MCHC: 30.8 g/dL (ref 30.0–36.0)
MCV: 85.1 fL (ref 80.0–100.0)
Platelets: 396 10*3/uL (ref 150–400)
RBC: 3.89 MIL/uL (ref 3.87–5.11)
RDW: 15.7 % — ABNORMAL HIGH (ref 11.5–15.5)
WBC: 8 10*3/uL (ref 4.0–10.5)
nRBC: 0 % (ref 0.0–0.2)

## 2022-07-08 LAB — COMPREHENSIVE METABOLIC PANEL
ALT: 11 U/L (ref 0–44)
AST: 16 U/L (ref 15–41)
Albumin: 2.9 g/dL — ABNORMAL LOW (ref 3.5–5.0)
Alkaline Phosphatase: 40 U/L (ref 38–126)
Anion gap: 6 (ref 5–15)
BUN: 11 mg/dL (ref 6–20)
CO2: 23 mmol/L (ref 22–32)
Calcium: 8.3 mg/dL — ABNORMAL LOW (ref 8.9–10.3)
Chloride: 107 mmol/L (ref 98–111)
Creatinine, Ser: 0.38 mg/dL — ABNORMAL LOW (ref 0.44–1.00)
GFR, Estimated: >60 mL/min (ref >60)
Glucose, Bld: 78 mg/dL (ref 70–99)
Potassium: 3.7 mmol/L (ref 3.5–5.1)
Sodium: 136 mmol/L (ref 135–145)
Total Bilirubin: 0.7 mg/dL (ref 0.3–1.2)
Total Protein: 6.6 g/dL (ref 6.5–8.1)

## 2022-07-08 MED ORDER — PANTOPRAZOLE SODIUM 40 MG IV SOLR
40.0000 mg | Freq: Two times a day (BID) | INTRAVENOUS | Status: DC
  Administered 2022-07-09 – 2022-07-14 (×12): 40 mg via INTRAVENOUS
  Filled 2022-07-08 (×12): qty 10

## 2022-07-08 NOTE — Hospital Course (Addendum)
Taken from H&P.  Jessica Stephenson is a 57 y.o. female with medical history significant of scleroderma, GERD, pulmonary fibrosis, Raynaud's phenomenon presenting with severe constipation as evident on CT abdomen and pelvis.  Patient has a longstanding history of constipation most likely secondary to scleroderma.  She was recently started on Myfortic in addition to Plaquenil for scleroderma . She was also experiencing excessive nausea limiting her p.o. intake.  Last good bowel movement was about a month ago, tried over-the-counter enemas with no success.  On presentation she was hemodynamically stable.  Labs seems stable. CT abdomen pelvis with lower esophageal circumferential wall thickening consistent with scleroderma as well as marked constipation throughout the colon in the setting of dysmotility and chronic interstitial lung disease.   GI was also consulted.  They are recommending enemas and suppositories until things started moving.  They also order GoLytely with instructions to only take few sips at a time if she can tolerate it.  5/12: Vital stable.  Received multiple enemas and suppository without any success.  Unable to tolerate GoLytely-encouraged to take small sips at a time.  Might need physical disimpaction.  5/13: Still no significant bowel movement.  Encouraging drinking GoLytely.  GI is on board

## 2022-07-08 NOTE — Progress Notes (Signed)
Progress Note   Patient: Jessica Stephenson ZOX:[id number] DOB: Feb 27, 1965 DOA: 07/07/2022     1 DOS: the patient was seen and examined on 07/08/2022   Brief hospital course: Taken from H&P.  Jessica Stephenson is a 57 y.o. female with medical history significant of scleroderma, GERD, pulmonary fibrosis, Raynaud's phenomenon presenting with severe constipation as evident on CT abdomen and pelvis.  Patient has a longstanding history of constipation most likely secondary to scleroderma.  She was recently started on Myfortic in addition to Plaquenil for scleroderma . She was also experiencing excessive nausea limiting her p.o. intake.  Last good bowel movement was about a month ago, tried over-the-counter enemas with no success.  On presentation she was hemodynamically stable.  Labs seems stable. CT abdomen pelvis with lower esophageal circumferential wall thickening consistent with scleroderma as well as marked constipation throughout the colon in the setting of dysmotility and chronic interstitial lung disease.   GI was also consulted.  They are recommending enemas and suppositories until things started moving.  They also order GoLytely with instructions to only take few sips at a time if she can tolerate it.  5/12: Vital stable.  Received multiple enemas and suppository without any success.  Unable to tolerate GoLytely-encouraged to take small sips at a time.  Might need physical disimpaction.     Assessment and Plan: * Constipation Worsening constipation in setting of baseline constipation w/ scleroderma associated dysmotility  Noted marked constipation w/ dysmotility on CT  GI was consulted and they were recommending lower bowel regimen with enemas and try taking GoLytely if able to tolerate-patient has an history of developing nausea with that in the past.  Still no success -Continue with your bowel regimen with enemas and encourage to take GoLytely. -Might need  physical disimpaction   Scleroderma (HCC) Baseline scleroderma previous on Plaquenil and Myfortic Followed by [facility] rheumatology Holding in the setting of severe constipation Monitor for now   Pulmonary fibrosis (HCC) Baseline pulmonary fibrosis in the setting of scleroderma Stable from respiratory standpoint Follow  GERD (gastroesophageal reflux disease) PPI   Subjective: Patient was seen and examined today.  Still no bowel movement yet.  Having tense and distended abdomen. Feeling some nausea while taking Golytely.  Physical Exam: Vitals:   07/07/22 1723 07/07/22 1907 07/08/22 0332 07/08/22 0731  BP: 121/71 122/68 115/66 120/60  Pulse: (!) 43 93 85 78  Resp: 18 16 20 16   Temp: 97.8 F (36.6 C) 98.6 F (37 C) 98 F (36.7 C) 97.8 F (36.6 C)  TempSrc:  Oral Oral Oral  SpO2: (!) 62% 98% 100% 99%  Weight:      Height:       General.  Frail and malnourished lady, in no acute distress. Pulmonary.  Lungs clear bilaterally, normal respiratory effort. CV.  Regular rate and rhythm, no JVD, rub or murmur. Abdomen.  Soft, nontender, distended with hypoactive bowel sounds. CNS.  Alert and oriented .  No focal neurologic deficit. Extremities.  No edema, no cyanosis, pulses intact and symmetrical. Psychiatry.  Judgment and insight appears normal.   Data Reviewed: Prior data reviewed  Family Communication: Discussed with son at bedside  Disposition: Status is: Inpatient Remains inpatient appropriate because: Severity of illness  Planned Discharge Destination: Home  DVT prophylaxis.  Lovenox Time spent: 45 minutes  This record has been created using Conservation officer, historic buildings. Errors have been sought and corrected,but may not always be located. Such creation errors do not reflect on  the standard of care.   Author: Arnetha Courser, MD 07/08/2022 1:18 PM  For on call review [email or web address].

## 2022-07-08 NOTE — Progress Notes (Signed)
GI Inpatient Follow-up Note  Subjective:  Patient seen and feels better but no significant bowel movements. Sipped a little of the golytely. Has not been moving much. Abdomen still distended but not worsened from yesterday.  Scheduled Inpatient Medications:   bisacodyl  10 mg Rectal Daily   enoxaparin (LOVENOX) injection  40 mg Subcutaneous Q24H   Glycerin (Adult)  1 suppository Rectal Daily   mineral oil  1 enema Rectal Daily   pantoprazole (PROTONIX) IV  40 mg Intravenous Q24H   sodium phosphate  1 enema Rectal BID    Continuous Inpatient Infusions:    sodium chloride 125 mL/hr at 07/08/22 0054    PRN Inpatient Medications:  ondansetron **OR** ondansetron (ZOFRAN) IV  Review of Systems:  Review of Systems  Constitutional:  Positive for malaise/fatigue. Negative for chills and fever.  Respiratory:  Negative for shortness of breath.   Cardiovascular:  Negative for chest pain.  Gastrointestinal:  Positive for constipation and nausea. Negative for abdominal pain and vomiting.  Musculoskeletal:  Positive for joint pain.  Neurological:  Negative for focal weakness.  Psychiatric/Behavioral:  The patient is not nervous/anxious.   All other systems reviewed and are negative.   Physical Examination: BP 120/60 (BP Location: Left Arm)   Pulse 78   Temp 97.8 F (36.6 C) (Oral)   Resp 16   Ht 5\' 2"  (1.575 m)   Wt 52.2 kg   SpO2 99%   BMI 21.03 kg/m  Gen: NAD HEENT: PEERLA, EOMI, Neck: supple, no JVD or thyromegaly Chest: No respiratory distress Abd: distended but soft Ext: no edema, well perfused with 2+ pulses, Skin: findings consistent with scleroderma Lymph: no LAD  Data: Lab Results  Component Value Date   WBC 8.0 07/08/2022   HGB 10.2 (L) 07/08/2022   HCT 33.1 (L) 07/08/2022   MCV 85.1 07/08/2022   PLT 396 07/08/2022   Recent Labs  Lab 07/07/22 0905 07/08/22 0451  HGB 13.5 10.2*   Lab Results  Component Value Date   NA 136 07/08/2022   K 3.7  07/08/2022   CL 107 07/08/2022   CO2 23 07/08/2022   BUN 11 07/08/2022   CREATININE 0.38 (L) 07/08/2022   Lab Results  Component Value Date   ALT 11 07/08/2022   AST 16 07/08/2022   ALKPHOS 40 07/08/2022   BILITOT 0.7 07/08/2022   No results for input(s): "APTT", "INR", "PTT" in the last 168 hours. Assessment/Plan: [name] is a 57 y.o. lady with history of scleroderma, pulmonary fibrosis, and raynaud's who is here for severe constipation as evidenced by stool on CT   Recommendations:  -continue current regimen - continue golytely as tolerated - continue IV fluids - encouraged her to get up and walk as much as possible - continue IV PPI - will continue to follow, please call with any questions or concerns   Merlyn Lot MD, MPH [facility] GI

## 2022-07-09 ENCOUNTER — Encounter: Payer: Self-pay | Admitting: Family Medicine

## 2022-07-09 DIAGNOSIS — K5900 Constipation, unspecified: Secondary | ICD-10-CM | POA: Diagnosis not present

## 2022-07-09 DIAGNOSIS — K219 Gastro-esophageal reflux disease without esophagitis: Secondary | ICD-10-CM | POA: Diagnosis not present

## 2022-07-09 DIAGNOSIS — J8410 Pulmonary fibrosis, unspecified: Secondary | ICD-10-CM | POA: Diagnosis not present

## 2022-07-09 DIAGNOSIS — M349 Systemic sclerosis, unspecified: Secondary | ICD-10-CM | POA: Diagnosis not present

## 2022-07-09 LAB — GLUCOSE, CAPILLARY: Glucose-Capillary: 74 mg/dL (ref 70–99)

## 2022-07-09 MED ORDER — ONDANSETRON HCL 4 MG/2ML IJ SOLN: 4.0000 mg | Freq: Four times a day (QID) | INTRAMUSCULAR | Status: DC | PRN

## 2022-07-09 NOTE — TOC CM/SW Note (Signed)
  Transition of Care [facility]) Screening Note   Patient Details  Name: Jessica Stephenson Date of Birth: 1966/01/09   Transition of Care [facility]) CM/SW Contact:    Margarito Liner, LCSW Phone Number: 07/09/2022, 9:59 AM    Transition of Care Department [facility]) has reviewed patient and no TOC needs have been identified at this time. We will continue to monitor patient advancement through interdisciplinary progression rounds. If new patient transition needs arise, please place a TOC consult.

## 2022-07-09 NOTE — Progress Notes (Signed)
Progress Note   Patient: Jessica Stephenson ZOX:[id number] DOB: 1966/01/27 DOA: 07/07/2022     2 DOS: the patient was seen and examined on 07/09/2022   Brief hospital course: Taken from H&P.  Jessica Stephenson is a 56 y.o. female with medical history significant of scleroderma, GERD, pulmonary fibrosis, Raynaud's phenomenon presenting with severe constipation as evident on CT abdomen and pelvis.  Patient has a longstanding history of constipation most likely secondary to scleroderma.  She was recently started on Myfortic in addition to Plaquenil for scleroderma . She was also experiencing excessive nausea limiting her p.o. intake.  Last good bowel movement was about a month ago, tried over-the-counter enemas with no success.  On presentation she was hemodynamically stable.  Labs seems stable. CT abdomen pelvis with lower esophageal circumferential wall thickening consistent with scleroderma as well as marked constipation throughout the colon in the setting of dysmotility and chronic interstitial lung disease.   GI was also consulted.  They are recommending enemas and suppositories until things started moving.  They also order GoLytely with instructions to only take few sips at a time if she can tolerate it.  5/12: Vital stable.  Received multiple enemas and suppository without any success.  Unable to tolerate GoLytely-encouraged to take small sips at a time.  Might need physical disimpaction.  5/13: Still no significant bowel movement.  Encouraging drinking GoLytely.  GI is on board     Assessment and Plan: * Constipation Worsening constipation in setting of baseline constipation w/ scleroderma associated dysmotility  Noted marked constipation w/ dysmotility on CT  GI was consulted and they were recommending lower bowel regimen with enemas and try taking GoLytely if able to tolerate-patient has an history of developing nausea with that in the past.  Still no  success -Continue with your bowel regimen with enemas and encourage to take GoLytely. -Might need physical disimpaction   Scleroderma (HCC) Baseline scleroderma previous on Plaquenil and Myfortic Followed by [facility] rheumatology Holding in the setting of severe constipation Monitor for now   Pulmonary fibrosis (HCC) Baseline pulmonary fibrosis in the setting of scleroderma Stable from respiratory standpoint Follow  GERD (gastroesophageal reflux disease) PPI   Subjective: Patient had 1 very small bowel movement last night, no significant bowel movement after that..  Physical Exam: Vitals:   07/08/22 1647 07/08/22 1951 07/09/22 0304 07/09/22 0800  BP: 111/65 129/68 116/61 124/86  Pulse: 83 81 78 75  Resp: 16 20 20 18   Temp: 98.5 F (36.9 C) 98.2 F (36.8 C) 98 F (36.7 C) 98.2 F (36.8 C)  TempSrc: Oral Oral Oral Oral  SpO2: 100% 100% 99% 94%  Weight:      Height:       General.  Frail and malnourished lady, in no acute distress. Pulmonary.  Lungs clear bilaterally, normal respiratory effort. CV.  Regular rate and rhythm, no JVD, rub or murmur. Abdomen.  Soft, nontender, distended, BS hypoactive CNS.  Alert and oriented .  No focal neurologic deficit. Extremities.  No edema, no cyanosis, pulses intact and symmetrical. Psychiatry.  Judgment and insight appears normal.   Data Reviewed: Prior data reviewed  Family Communication: Discussed with son at bedside  Disposition: Status is: Inpatient Remains inpatient appropriate because: Severity of illness  Planned Discharge Destination: Home  DVT prophylaxis.  Lovenox Time spent: 40 minutes  This record has been created using Conservation officer, historic buildings. Errors have been sought and corrected,but may not always be located. Such creation errors do  not reflect on the standard of care.   Author: Arnetha Courser, MD 07/09/2022 2:27 PM  For on call review [email or web address].

## 2022-07-09 NOTE — Progress Notes (Signed)
GI Inpatient Follow-up Note  Subjective:  Patient seen and states she had small amount of a bowel movement last night and just had a few small pieces come out after a suppository. Nausea better controlled so she was able to drink a 1/2 cup of golytely.  Scheduled Inpatient Medications:   bisacodyl  10 mg Rectal Daily   enoxaparin (LOVENOX) injection  40 mg Subcutaneous Q24H   Glycerin (Adult)  1 suppository Rectal Daily   mineral oil  1 enema Rectal Daily   pantoprazole (PROTONIX) IV  40 mg Intravenous Q12H   sodium phosphate  1 enema Rectal BID    Continuous Inpatient Infusions:    sodium chloride 125 mL/hr at 07/09/22 0255    PRN Inpatient Medications:  ondansetron **OR** ondansetron (ZOFRAN) IV  Review of Systems:  Review of Systems  Constitutional:  Negative for chills and fever.  Respiratory:  Negative for shortness of breath.   Cardiovascular:  Negative for chest pain.  Gastrointestinal:  Positive for constipation and nausea. Negative for abdominal pain and vomiting.  Musculoskeletal:  Positive for joint pain.  Skin:  Negative for rash.  Neurological:  Negative for focal weakness.  Psychiatric/Behavioral:  Negative for substance abuse.   All other systems reviewed and are negative.     Physical Examination: BP 124/86   Pulse 75   Temp 98.2 F (36.8 C) (Oral)   Resp 18   Ht 5\' 2"  (1.575 m)   Wt 52.2 kg   SpO2 94%   BMI 21.03 kg/m  Gen: NAD, alert and oriented x 4 HEENT: PEERLA, EOMI, Neck: supple Chest: No respiratory distress Abd: distended Ext: no edema Skin: no rash or lesions noted Lymph: no LAD  Data: Lab Results  Component Value Date   WBC 8.0 07/08/2022   HGB 10.2 (L) 07/08/2022   HCT 33.1 (L) 07/08/2022   MCV 85.1 07/08/2022   PLT 396 07/08/2022   Recent Labs  Lab 07/07/22 0905 07/08/22 0451  HGB 13.5 10.2*   Lab Results  Component Value Date   NA 136 07/08/2022   K 3.7 07/08/2022   CL 107 07/08/2022   CO2 23 07/08/2022   BUN  11 07/08/2022   CREATININE 0.38 (L) 07/08/2022   Lab Results  Component Value Date   ALT 11 07/08/2022   AST 16 07/08/2022   ALKPHOS 40 07/08/2022   BILITOT 0.7 07/08/2022   No results for input(s): "APTT", "INR", "PTT" in the last 168 hours. Assessment/Plan: [name] is a 57 y.o. lady with history of scleroderma, pulmonary fibrosis, and raynaud's who is here for severe constipation as evidenced by stool on CT. A small amount of movement today so hopefully over the next 24 hours we'll see a good movement  Recommendations:  - continue current regimen - continue golytely as tolerated - continue IV fluids - continue to move as much as possible - continue IV PPI and anti-emetics - would not recommend reglan until she has a more substantial bowel movement - will continue to follow, please call with any questions or concerns   Merlyn Lot MD, MPH [facility]

## 2022-07-10 DIAGNOSIS — K5902 Outlet dysfunction constipation: Secondary | ICD-10-CM | POA: Diagnosis not present

## 2022-07-10 DIAGNOSIS — M349 Systemic sclerosis, unspecified: Secondary | ICD-10-CM | POA: Diagnosis not present

## 2022-07-10 DIAGNOSIS — K219 Gastro-esophageal reflux disease without esophagitis: Secondary | ICD-10-CM | POA: Diagnosis not present

## 2022-07-10 DIAGNOSIS — J8410 Pulmonary fibrosis, unspecified: Secondary | ICD-10-CM | POA: Diagnosis not present

## 2022-07-10 MED ORDER — SORBITOL 70 % SOLN
960.0000 mL | TOPICAL_OIL | Freq: Once | ORAL | Status: AC
  Administered 2022-07-10: 960 mL via RECTAL
  Filled 2022-07-10: qty 240

## 2022-07-10 NOTE — Progress Notes (Signed)
Initial Nutrition Assessment  DOCUMENTATION CODES:   Non-severe (moderate) malnutrition in context of chronic illness  INTERVENTION:   Will plan on TPN to start 5/15 if constipation has not resolved.   Will add supplements as needed.   Pt at high refeed risk; recommend monitor potassium, magnesium and phosphorus labs daily until stable  Daily weights  NUTRITION DIAGNOSIS:   Moderate Malnutrition related to chronic illness as evidenced by moderate fat depletion, moderate muscle depletion.  GOAL:   Patient will meet greater than or equal to 90% of their needs  MONITOR:   PO intake, Supplement acceptance, Diet advancement, Labs, Weight trends, I & O's, Skin  REASON FOR ASSESSMENT:   Consult Assessment of nutrition requirement/status  ASSESSMENT:   57 y/o female with h/o Raynaud's syndrome, scleroderma, ILD, GERD and IBS who is admitted with severe constipation.  Met with pt and husband in room today. Pt reports decreased appetite and oral intake for the past four months r/t chronic constipation. Pt reports that in November, the constipation started after she was started on a new medication. Pt reports that prior to this, she was having soft stools, twice daily. Pt does report a remote history of constipation in the past but reports that she mainly struggles with diarrhea. Pt reports that she was seen by GI several years ago and was diagnosed with IBS. Pt reports that over the past 6 weeks, she has been having severe nausea and abdominal distension making it hard for her to eat. Pt reports early satiety. Pt reports that she has mainly been eating soups and liquids at home. Pt reports that prior to her constipation, she was eating a lot of fruit and drinking 1-2 Boost supplements per day. Pt reports occasionally food getting stuck in her throat. Pt avoids red meat and anything tough to swallow. Pt also reports severe GERD as to which she takes Prilosec for at home. Pt reports that she  is lactose intolerant. Pt also avoids gluten as she reports celiac disease. Per chart, pt was seen by GI and found to have positive antigliadin antibody but a negative TTG; pt never followed up with EGD/colonoscopy for biopsies.   Per chart, pt is down ~6lbs(5%) since February but appears to be down ~30lbs over the past 4-5 years. When asked about a feeding tube today, pt reports that she has spoken with her MD at [facility] about the recommendation for feeding tube in the past but reports that she is fearful of this as it "sounds scary". RD provided pt with some education regarding G-tube and nutrition support. RD does feel as though pt will eventually require a feeding tube to meet her estimated needs. For the acute issue of constipation, TPN was discussed with pt. Pt is fearful of getting a PICC line but reports that she is willing to get TPN if needed. Will plan to see how patient does overnight as far as passing bowel movements and decide on TPN tomorrow; this was discussed with MD. Pt is at high refeed risk. Pt is agreeable to supplements once appropriate.      Medications reviewed and include: dulcolax, lovenox, protonix, NaCl @125ml /hr   Labs reviewed: Na 136 wnl, K 3.7 wnl Hgb 10.2(L), Hct 33.1(L)  NUTRITION - FOCUSED PHYSICAL EXAM:  Flowsheet Row Most Recent Value  Orbital Region Mild depletion  Upper Arm Region Severe depletion  Thoracic and Lumbar Region Moderate depletion  Buccal Region Mild depletion  Temple Region Mild depletion  Clavicle Bone Region Severe depletion  Clavicle and Acromion Bone Region Severe depletion  Scapular Bone Region Moderate depletion  Dorsal Hand Moderate depletion  Patellar Region Mild depletion  Anterior Thigh Region Mild depletion  Posterior Calf Region Mild depletion  Edema (RD Assessment) None  Hair Reviewed  Eyes Reviewed  Mouth Reviewed  Skin Reviewed  Nails Reviewed   Diet Order:   Diet Order             Diet clear liquid Room service  appropriate? Yes; Fluid consistency: Thin  Diet effective now                  EDUCATION NEEDS:   Education needs have been addressed  Skin:  Skin Assessment: Reviewed RN Assessment  Last BM:  5/14- small amounts of hard stool  Height:   Ht Readings from Last 1 Encounters:  07/07/22 5\' 2"  (1.575 m)    Weight:   Wt Readings from Last 1 Encounters:  07/07/22 52.2 kg    Ideal Body Weight:  50 kg  BMI:  Body mass index is 21.03 kg/m.  Estimated Nutritional Needs:   Kcal:  1400-1600kcal/day  Protein:  70-80g/day  Fluid:  1.6-1.8L/day  Betsey Holiday MS, RD, LDN Please refer to [facility] for RD and/or RD on-call/weekend/after hours pager

## 2022-07-10 NOTE — Progress Notes (Signed)
  Progress Note   Patient: Jessica Stephenson ZOX:[id number] DOB: Sep 04, 1965 DOA: 07/07/2022     3 DOS: the patient was seen and examined on 07/10/2022   Brief hospital course: Kathey Spicer is a 57 y.o. female with medical history significant of scleroderma, GERD, pulmonary fibrosis, Raynaud's phenomenon presenting with severe constipation as evident on CT abdomen and pelvis.  Patient has a longstanding history of constipation most likely secondary to scleroderma.  She was recently started on Myfortic in addition to Plaquenil for scleroderma. She was also experiencing excessive nausea limiting her p.o. intake.  Last good bowel movement was about a month ago, tried over-the-counter enemas with no success.  5/12: Vital stable.  Received multiple enemas and suppository without any success.  Unable to tolerate GoLytely-encouraged to take small sips at a time.  Might need physical disimpaction.  5/13-14: Still no significant bowel movement although making some progress with small BMs. Encouraging ongoing intake of GoLytely as tolerated. GI is on board  Assessment and Plan:  Constipation Worsening constipation in setting of baseline constipation w/ scleroderma associated dysmotility  Noted marked constipation w/ dysmotility on CT  GI following - continue lower bowel regimen with enemas and increase PO intake of GoLytely if able to tolerate  Scleroderma (HCC) Holding Plaquenil and Myfortic given above Followed by [facility] rheumatology  Pulmonary fibrosis (HCC) Baseline pulmonary fibrosis in the setting of scleroderma Stable from respiratory standpoint Follow  GERD (gastroesophageal reflux disease) PPI  Subjective: Small BM noted but no real improvement in symptoms.  Physical Exam: Vitals:   07/09/22 0800 07/09/22 1743 07/09/22 1907 07/10/22 0439  BP: 124/86 95/61 124/64 123/80  Pulse: 75 75 84 79  Resp: 18 18 20 17   Temp: 98.2 F (36.8 C) (!) 97.3 F (36.3 C) 97.8  F (36.6 C) 98.7 F (37.1 C)  TempSrc: Oral Oral Oral   SpO2: 94% 96% 100% 98%  Weight:      Height:       General.  Frail and malnourished lady, in no acute distress. Pulmonary.  Lungs clear bilaterally, normal respiratory effort. CV.  Regular rate and rhythm, no JVD, rub or murmur. Abdomen.  Soft, nontender, distended, BS hypoactive CNS.  Alert and oriented .  No focal neurologic deficit. Extremities.  No edema, no cyanosis, pulses intact and symmetrical.  Data Reviewed: Prior data reviewed  Family Communication: Son at bedside  Disposition: Status is: Inpatient Remains inpatient appropriate because: Severity of illness  Planned Discharge Destination: Home  DVT prophylaxis.  Lovenox Time spent: 40 minutes  Author: Carma Leaven DO  07/10/2022 7:00 AM  For on call review [email or web address].

## 2022-07-10 NOTE — Progress Notes (Signed)
GI Inpatient Follow-up Note  Subjective:  Patient seen and seems to have been having several small bowel movements today. Normally every time after she walks. Passing gas.  Scheduled Inpatient Medications:   bisacodyl  10 mg Rectal Daily   enoxaparin (LOVENOX) injection  40 mg Subcutaneous Q24H   Glycerin (Adult)  1 suppository Rectal Daily   mineral oil  1 enema Rectal Daily   pantoprazole (PROTONIX) IV  40 mg Intravenous Q12H   sodium phosphate  1 enema Rectal BID   sorbitol, milk of mag, mineral oil, glycerin (SMOG) enema  960 mL Rectal Once    Continuous Inpatient Infusions:    sodium chloride 125 mL/hr at 07/10/22 0624    PRN Inpatient Medications:  ondansetron **OR** ondansetron (ZOFRAN) IV  Review of Systems:  Review of Systems  Constitutional:  Negative for chills and fever.  Respiratory:  Negative for shortness of breath.   Cardiovascular:  Negative for chest pain.  Gastrointestinal:  Positive for abdominal pain, constipation and nausea.  Musculoskeletal:  Negative for joint pain.  Skin:  Negative for rash.  Neurological:  Negative for focal weakness.  Psychiatric/Behavioral:  Negative for substance abuse.   All other systems reviewed and are negative.     Physical Examination: BP 126/70 (BP Location: Left Arm)   Pulse 75   Temp 97.9 F (36.6 C) (Oral)   Resp 17   Ht 5\' 2"  (1.575 m)   Wt 52.2 kg   SpO2 98%   BMI 21.03 kg/m  Gen: NAD, alert and oriented x 4 HEENT: PEERLA, EOMI, Neck: supple, no JVD or thyromegaly Chest: No respiratory distress Abd: distended Ext: no edema, well perfused with 2+ pulses, Skin: no rash or lesions noted Lymph: no LAD  Data: Lab Results  Component Value Date   WBC 8.0 07/08/2022   HGB 10.2 (L) 07/08/2022   HCT 33.1 (L) 07/08/2022   MCV 85.1 07/08/2022   PLT 396 07/08/2022   Recent Labs  Lab 07/07/22 0905 07/08/22 0451  HGB 13.5 10.2*   Lab Results  Component Value Date   NA 136 07/08/2022   K 3.7  07/08/2022   CL 107 07/08/2022   CO2 23 07/08/2022   BUN 11 07/08/2022   CREATININE 0.38 (L) 07/08/2022   Lab Results  Component Value Date   ALT 11 07/08/2022   AST 16 07/08/2022   ALKPHOS 40 07/08/2022   BILITOT 0.7 07/08/2022   No results for input(s): "APTT", "INR", "PTT" in the last 168 hours. Assessment/Plan: [name] is a 57 y.o. lady with history of scleroderma, pulmonary fibrosis, and raynaud's who is here for severe constipation as evidenced by stool on CT. A little more progress today  Recommendations:  - continue current regimen, will add a different enema as well - continue golytely as tolerated - continue IV fluids - continue to move as much as possible - continue IV PPI and anti-emetics - would not recommend reglan until she has a more substantial bowel movement - will consult nutrition given malnutrition, if no substantial movement in the next day where we could advance her diet, may need to consider TPN given severe malnutrition   Jessica Lot MD, MPH [facility] GI

## 2022-07-11 DIAGNOSIS — K5902 Outlet dysfunction constipation: Secondary | ICD-10-CM

## 2022-07-11 DIAGNOSIS — E440 Moderate protein-calorie malnutrition: Secondary | ICD-10-CM | POA: Insufficient documentation

## 2022-07-11 MED ORDER — ENSURE ENLIVE PO LIQD
237.0000 mL | Freq: Three times a day (TID) | ORAL | Status: DC
  Administered 2022-07-12 – 2022-07-16 (×10): 237 mL via ORAL

## 2022-07-11 MED ORDER — BOOST / RESOURCE BREEZE PO LIQD CUSTOM
1.0000 | Freq: Three times a day (TID) | ORAL | Status: DC
  Administered 2022-07-11: 1 via ORAL

## 2022-07-11 MED ORDER — ADULT MULTIVITAMIN W/MINERALS CH
1.0000 | ORAL_TABLET | Freq: Every day | ORAL | Status: DC
  Administered 2022-07-12 – 2022-07-17 (×6): 1 via ORAL
  Filled 2022-07-11 (×6): qty 1

## 2022-07-11 MED ORDER — BOOST / RESOURCE BREEZE PO LIQD CUSTOM: 1.0000 | Freq: Three times a day (TID) | ORAL | Status: DC

## 2022-07-11 NOTE — Progress Notes (Signed)
Nutrition Follow Up Note   DOCUMENTATION CODES:   Non-severe (moderate) malnutrition in context of chronic illness  INTERVENTION:   Ensure Enlive po TID, each supplement provides 350 kcal and 20 grams of protein.  MVI po daily   Pt at high refeed risk; recommend monitor potassium, magnesium and phosphorus labs daily until stable  Daily weights  NUTRITION DIAGNOSIS:   Moderate Malnutrition related to chronic illness as evidenced by moderate fat depletion, moderate muscle depletion.  GOAL:   Patient will meet greater than or equal to 90% of their needs -progressing   MONITOR:   PO intake, Supplement acceptance, Diet advancement, Labs, Weight trends, I & O's, Skin  ASSESSMENT:   57 y/o female with h/o Raynaud's syndrome, scleroderma, ILD, GERD and IBS who is admitted with severe constipation.  Met with pt and son in room today. Pt is sitting up on the side of the bed and has just finished 100% of her breakfast. Pt reports that she has been up all night passing small bowel movements. Pt reports that she is feeling better today. Pt reports her appetite has returned and denies any nausea or vomiting after eating. Will plan to hold on TPN for now; pt aware that she may need TPN if her nausea or vomiting returns. RD will add supplements and MVI to help pt meet her estimated needs. Pt is at high refeed risk.     Medications reviewed and include: lovenox, protonix, NaCl @100ml /hr   Labs reviewed: none today   Diet Order:   Diet Order             Diet full liquid Room service appropriate? Yes; Fluid consistency: Thin  Diet effective now                  EDUCATION NEEDS:   Education needs have been addressed  Skin:  Skin Assessment: Reviewed RN Assessment  Last BM:  5/15  Height:   Ht Readings from Last 1 Encounters:  07/07/22 5\' 2"  (1.575 m)    Weight:   Wt Readings from Last 1 Encounters:  07/07/22 52.2 kg    Ideal Body Weight:  50 kg  BMI:  Body mass  index is 21.03 kg/m.  Estimated Nutritional Needs:   Kcal:  1400-1600kcal/day  Protein:  70-80g/day  Fluid:  1.6-1.8L/day  Betsey Holiday MS, RD, LDN Please refer to [facility] for RD and/or RD on-call/weekend/after hours pager

## 2022-07-11 NOTE — Progress Notes (Signed)
GI Inpatient Follow-up Note  Subjective:  Patient seen and had several bowel movements overnight. Feels like appetite is improving. Saw patient walking the halls.  Scheduled Inpatient Medications:   enoxaparin (LOVENOX) injection  40 mg Subcutaneous Q24H   feeding supplement  1 Container Oral TID BM   pantoprazole (PROTONIX) IV  40 mg Intravenous Q12H   sodium phosphate  1 enema Rectal BID    Continuous Inpatient Infusions:    sodium chloride 125 mL/hr at 07/11/22 0859    PRN Inpatient Medications:  ondansetron **OR** ondansetron (ZOFRAN) IV  Review of Systems:  Review of Systems  Constitutional:  Negative for chills and fever.  Respiratory:  Negative for shortness of breath.   Cardiovascular:  Negative for chest pain.  Gastrointestinal:  Negative for blood in stool, melena, nausea and vomiting.  Skin:  Negative for rash.  Neurological:  Negative for focal weakness.  Psychiatric/Behavioral:  Negative for substance abuse.   All other systems reviewed and are negative.    Physical Examination: BP 114/68 (BP Location: Left Arm)   Pulse 82   Temp (!) 97.5 F (36.4 C) (Oral)   Resp 16   Ht 5\' 2"  (1.575 m)   Wt 52.2 kg   SpO2 97%   BMI 21.03 kg/m  Gen: NAD, alert and oriented x 4 HEENT: PEERLA, EOMI, Neck: supple, no JVD or thyromegaly Chest: No respiratory distress Abd: distended, improved from yesterday Ext: no edema, well perfused with 2+ pulses, Skin: no rash or lesions noted Lymph: no lymphadenopathy  Data: Lab Results  Component Value Date   WBC 8.0 07/08/2022   HGB 10.2 (L) 07/08/2022   HCT 33.1 (L) 07/08/2022   MCV 85.1 07/08/2022   PLT 396 07/08/2022   Recent Labs  Lab 07/07/22 0905 07/08/22 0451  HGB 13.5 10.2*   Lab Results  Component Value Date   NA 136 07/08/2022   K 3.7 07/08/2022   CL 107 07/08/2022   CO2 23 07/08/2022   BUN 11 07/08/2022   CREATININE 0.38 (L) 07/08/2022   Lab Results  Component Value Date   ALT 11 07/08/2022    AST 16 07/08/2022   ALKPHOS 40 07/08/2022   BILITOT 0.7 07/08/2022   No results for input(s): "APTT", "INR", "PTT" in the last 168 hours. Assessment/Plan: Jessica Stephenson is a 57 y.o. lady with history of scleroderma, pulmonary fibrosis, and raynaud's who is here for severe constipation as evidenced by stool on CT. Several bowel movements last night but still has significant progress to make.  Recommendations:  - will clean up enema list some - encouraged to continue golytely - continue IV fluids - continue to move as much as possible - continue IV PPI and anti-emetics - would not recommend reglan - will advance diet to full liquids - appreciate nutrition recommendations, boost breeze added - have discussed with nursing about not waking patient up in the mornings for vital sign checks - still has a significant amount of stool to clean out before considering discharge  Please call with any questions or concerns.   Merlyn Lot MD, MPH [facility] GI

## 2022-07-11 NOTE — Progress Notes (Signed)
PROGRESS NOTE    Jessica Stephenson  ZOX:[id number] DOB: 1965/07/03 DOA: 07/07/2022 PCP: Dione Housekeeper, MD    Brief Narrative:  Jessica Stephenson is a 57 y.o. female with medical history significant of scleroderma, GERD, pulmonary fibrosis, Raynaud's phenomenon presenting with severe constipation as evident on CT abdomen and pelvis.   Patient has a longstanding history of constipation most likely secondary to scleroderma.  She was recently started on Myfortic in addition to Plaquenil for scleroderma. She was also experiencing excessive nausea limiting her p.o. intake.  Last good bowel movement was about a month ago, tried over-the-counter enemas with no success.   5/12: Vital stable.  Received multiple enemas and suppository without any success.  Unable to tolerate GoLytely-encouraged to take small sips at a time.  Might need physical disimpaction.   5/13-14: Still no significant bowel movement although making some progress with small BMs. Encouraging ongoing intake of GoLytely as tolerated. GI is on board 5/15: Spent all night having small bowel movements.  Feeling better.  Requesting second bowel of chicken noodle soup.  Can likely defer initiation of TPN at this time.   Assessment & Plan:   Principal Problem:   Constipation Active Problems:   Scleroderma (HCC)   Pulmonary fibrosis (HCC)   GERD (gastroesophageal reflux disease)   Malnutrition of moderate degree  Constipation Worsening constipation in setting of baseline constipation w/ scleroderma associated dysmotility  Noted marked constipation w/ dysmotility on CT  GI following  Plan: Continue bowel regimen Continue liquid diet, defer to GI regarding advancement Mobilize as much as tolerated  Scleroderma (HCC) Holding Plaquenil and Myfortic given above Followed by [facility] rheumatology Restart as appropriate   Pulmonary fibrosis (HCC) Baseline pulmonary fibrosis in the setting of  scleroderma Stable from respiratory standpoint Follow   GERD (gastroesophageal reflux disease) PPI  Moderate malnutrition RD following   DVT prophylaxis: SQ Lovenox Code Status: Full Family Communication: Son at bedside 5/15 Disposition Plan: Status is: Inpatient Remains inpatient appropriate because: Constipation/obstruction   Level of care: Med-Surg  Consultants:  GI  Procedures:  None  Antimicrobials: None   Subjective: Seen and examined.  Sitting up on edge of bed.  Visibly no distress.  Objective: Vitals:   07/10/22 1535 07/10/22 1925 07/11/22 0421 07/11/22 0711  BP: 128/75 115/76 129/77 114/68  Pulse: 80 84 88 82  Resp: 18 18 18 16   Temp: 98.2 F (36.8 C) 97.8 F (36.6 C) 98.2 F (36.8 C) (!) 97.5 F (36.4 C)  TempSrc:   Oral Oral  SpO2: 100% 99% 98% 97%  Weight:      Height:        Intake/Output Summary (Last 24 hours) at 07/11/2022 1241 Last data filed at 07/10/2022 2042 Gross per 24 hour  Intake 1440 ml  Output --  Net 1440 ml   Filed Weights   07/07/22 0800  Weight: 52.2 kg    Examination:  General exam: No acute distress Respiratory system: Clear to auscultation. Respiratory effort normal. Cardiovascular system: S1-S2, RRR, no murmurs, no pedal edema Gastrointestinal system: Thin, distended, nontender, hypoactive bowel sounds. Central nervous system: Alert and oriented. No focal neurological deficits. Extremities: Symmetric 5 x 5 power. Skin: No rashes, lesions or ulcers Psychiatry: Judgement and insight appear normal. Mood & affect appropriate.     Data Reviewed: I have personally reviewed following labs and imaging studies  CBC: Recent Labs  Lab 07/07/22 0905 07/08/22 0451  WBC 9.5 8.0  HGB 13.5 10.2*  HCT 44.6  33.1*  MCV 85.6 85.1  PLT 454* 396   Basic Metabolic Panel: Recent Labs  Lab 07/07/22 0905 07/08/22 0451  NA 132* 136  K 4.4 3.7  CL 97* 107  CO2 25 23  GLUCOSE 103* 78  BUN 15 11  CREATININE 0.59  0.38*  CALCIUM 9.1 8.3*   GFR: Estimated Creatinine Clearance: 62.8 mL/min (A) (by C-G formula based on SCr of 0.38 mg/dL (L)). Liver Function Tests: Recent Labs  Lab 07/07/22 0905 07/08/22 0451  AST 22 16  ALT 13 11  ALKPHOS 51 40  BILITOT 0.5 0.7  PROT 8.7* 6.6  ALBUMIN 3.6 2.9*   Recent Labs  Lab 07/07/22 0905  LIPASE 33   No results for input(s): "AMMONIA" in the last 168 hours. Coagulation Profile: No results for input(s): "INR", "PROTIME" in the last 168 hours. Cardiac Enzymes: No results for input(s): "CKTOTAL", "CKMB", "CKMBINDEX", "TROPONINI" in the last 168 hours. BNP (last 3 results) No results for input(s): "PROBNP" in the last 8760 hours. HbA1C: No results for input(s): "HGBA1C" in the last 72 hours. CBG: Recent Labs  Lab 07/09/22 0734  GLUCAP 74   Lipid Profile: No results for input(s): "CHOL", "HDL", "LDLCALC", "TRIG", "CHOLHDL", "LDLDIRECT" in the last 72 hours. Thyroid Function Tests: No results for input(s): "TSH", "T4TOTAL", "FREET4", "T3FREE", "THYROIDAB" in the last 72 hours. Anemia Panel: No results for input(s): "VITAMINB12", "FOLATE", "FERRITIN", "TIBC", "IRON", "RETICCTPCT" in the last 72 hours. Sepsis Labs: No results for input(s): "PROCALCITON", "LATICACIDVEN" in the last 168 hours.  No results found for this or any previous visit (from the past 240 hour(s)).       Radiology Studies: No results found.      Scheduled Meds:  bisacodyl  10 mg Rectal Daily   enoxaparin (LOVENOX) injection  40 mg Subcutaneous Q24H   Glycerin (Adult)  1 suppository Rectal Daily   mineral oil  1 enema Rectal Daily   pantoprazole (PROTONIX) IV  40 mg Intravenous Q12H   sodium phosphate  1 enema Rectal BID   Continuous Infusions:  sodium chloride 125 mL/hr at 07/11/22 0859     LOS: 4 days     Tresa Moore, MD [facility]   If 7PM-7AM, please contact night-coverage  07/11/2022, 12:41 PM

## 2022-07-12 ENCOUNTER — Inpatient Hospital Stay: Payer: BC Managed Care – PPO

## 2022-07-12 DIAGNOSIS — K5902 Outlet dysfunction constipation: Secondary | ICD-10-CM | POA: Diagnosis not present

## 2022-07-12 LAB — BASIC METABOLIC PANEL
Anion gap: 6 (ref 5–15)
BUN: <5 mg/dL — ABNORMAL LOW (ref 6–20)
CO2: 24 mmol/L (ref 22–32)
Calcium: 7.7 mg/dL — ABNORMAL LOW (ref 8.9–10.3)
Chloride: 106 mmol/L (ref 98–111)
Creatinine, Ser: 0.45 mg/dL (ref 0.44–1.00)
GFR, Estimated: >60 mL/min (ref >60)
Glucose, Bld: 76 mg/dL (ref 70–99)
Potassium: 3.2 mmol/L — ABNORMAL LOW (ref 3.5–5.1)
Sodium: 136 mmol/L (ref 135–145)

## 2022-07-12 LAB — PHOSPHORUS: Phosphorus: 3.6 mg/dL (ref 2.5–4.6)

## 2022-07-12 LAB — MAGNESIUM: Magnesium: 1.5 mg/dL — ABNORMAL LOW (ref 1.7–2.4)

## 2022-07-12 MED ORDER — MAGNESIUM SULFATE 2 GM/50ML IV SOLN
2.0000 g | Freq: Once | INTRAVENOUS | Status: AC
  Administered 2022-07-12: 2 g via INTRAVENOUS
  Filled 2022-07-12: qty 50

## 2022-07-12 MED ORDER — POTASSIUM CHLORIDE 10 MEQ/100ML IV SOLN
10.0000 meq | INTRAVENOUS | Status: AC
  Administered 2022-07-12 (×2): 10 meq via INTRAVENOUS
  Filled 2022-07-12: qty 100

## 2022-07-12 NOTE — Plan of Care (Signed)

## 2022-07-12 NOTE — Progress Notes (Signed)
GI Inpatient Follow-up Note  Subjective:  Patient seen and had several more bowel movements overnight that she describes as "normal". Not hard but not diarrhea. Had some nausea this morning.  Scheduled Inpatient Medications:   enoxaparin (LOVENOX) injection  40 mg Subcutaneous Q24H   feeding supplement  237 mL Oral TID BM   multivitamin with minerals  1 tablet Oral Daily   pantoprazole (PROTONIX) IV  40 mg Intravenous Q12H   sodium phosphate  1 enema Rectal BID    Continuous Inpatient Infusions:    sodium chloride 75 mL/hr at 07/12/22 0955   potassium chloride 10 mEq (07/12/22 1352)    PRN Inpatient Medications:  ondansetron **OR** ondansetron (ZOFRAN) IV  Review of Systems:  Review of Systems  Constitutional:  Negative for chills and fever.  Respiratory:  Positive for cough. Negative for shortness of breath.   Cardiovascular:  Negative for chest pain.  Gastrointestinal:  Positive for abdominal pain and nausea.  Musculoskeletal:  Positive for joint pain.  Skin:  Negative for rash.  Neurological:  Negative for focal weakness.  Psychiatric/Behavioral:  Negative for substance abuse.   All other systems reviewed and are negative.     Physical Examination: BP 125/74 (BP Location: Left Arm)   Pulse 81   Temp 98 F (36.7 C)   Resp 16   Ht 5\' 2"  (1.575 m)   Wt 58.8 kg   SpO2 97%   BMI 23.72 kg/m  Gen: NAD, alert and oriented x 4 HEENT: PEERLA, EOMI, Neck: supple, no JVD or thyromegaly Chest: No respiratory distress Abd: distended Ext: no edema, well perfused with 2+ pulses, Skin: no rash or lesions noted Lymph: no LAD  Data: Lab Results  Component Value Date   WBC 8.0 07/08/2022   HGB 10.2 (L) 07/08/2022   HCT 33.1 (L) 07/08/2022   MCV 85.1 07/08/2022   PLT 396 07/08/2022   Recent Labs  Lab 07/07/22 0905 07/08/22 0451  HGB 13.5 10.2*   Lab Results  Component Value Date   NA 136 07/12/2022   K 3.2 (L) 07/12/2022   CL 106 07/12/2022   CO2 24  07/12/2022   BUN <5 (L) 07/12/2022   CREATININE 0.45 07/12/2022   Lab Results  Component Value Date   ALT 11 07/08/2022   AST 16 07/08/2022   ALKPHOS 40 07/08/2022   BILITOT 0.7 07/08/2022   No results for input(s): "APTT", "INR", "PTT" in the last 168 hours. Assessment/Plan: [name] is a 57 y.o. lady with history of scleroderma, pulmonary fibrosis, and raynaud's who is here for severe constipation as evidenced by stool on CT. Still with bowel movements overnight but she has a lot of progress to make.  Recommendations:  - encouraged to continue golytely - continue IV fluids - continue to move as much as possible - continue IV PPI and anti-emetics - would not recommend reglan - ok for soft diet but cautioned patient not to eat if she has any nausea - appreciate nutrition recommendations - still has a significant amount of stool to clean out before considering discharge   Please call with any questions or concerns.   Merlyn Lot MD, MPH [facility] GI

## 2022-07-12 NOTE — Progress Notes (Signed)
PROGRESS NOTE    Jessica Stephenson  ZOX:[id number] DOB: 08-Dec-1965 DOA: 07/07/2022 PCP: Dione Housekeeper, MD    Brief Narrative:  Jessica Stephenson is a 57 y.o. female with medical history significant of scleroderma, GERD, pulmonary fibrosis, Raynaud's phenomenon presenting with severe constipation as evident on CT abdomen and pelvis.   Patient has a longstanding history of constipation most likely secondary to scleroderma.  She was recently started on Myfortic in addition to Plaquenil for scleroderma. She was also experiencing excessive nausea limiting her p.o. intake.  Last good bowel movement was about a month ago, tried over-the-counter enemas with no success.   5/12: Vital stable.  Received multiple enemas and suppository without any success.  Unable to tolerate GoLytely-encouraged to take small sips at a time.  Might need physical disimpaction.   5/13-14: Still no significant bowel movement although making some progress with small BMs. Encouraging ongoing intake of GoLytely as tolerated. GI is on board 5/15: Spent all night having small bowel movements.  Feeling better.  Requesting second bowel of chicken noodle soup.  Can likely defer initiation of TPN at this time.   Assessment & Plan:   Principal Problem:   Constipation Active Problems:   Scleroderma (HCC)   Pulmonary fibrosis (HCC)   GERD (gastroesophageal reflux disease)   Malnutrition of moderate degree  Constipation Worsening constipation in setting of baseline constipation w/ scleroderma associated dysmotility  Noted marked constipation w/ dysmotility on CT  GI following  Plan: Continue bowel regimen Advance to soft diet per GI Mobilize as much as tolerated  Scleroderma (HCC) Holding Plaquenil and Myfortic given above Followed by [facility] rheumatology Patient continues to have bowel movements can consider restarting in the next 24 to 48 hours   Pulmonary fibrosis (HCC) Baseline pulmonary  fibrosis in the setting of scleroderma Stable from respiratory standpoint Follow   GERD (gastroesophageal reflux disease) IV PPI  Moderate malnutrition RD following   DVT prophylaxis: SQ Lovenox Code Status: Full Family Communication: Son at bedside 5/15, 5/16 Disposition Plan: Status is: Inpatient Remains inpatient appropriate because: Constipation/obstruction   Level of care: Med-Surg  Consultants:  GI  Procedures:  None  Antimicrobials: None   Subjective: Seen and examined.  Sitting up on edge of bed.  Complains of nausea this morning  Objective: Vitals:   07/11/22 1507 07/11/22 2022 07/12/22 0717 07/12/22 1157  BP: 126/76 127/73 125/74   Pulse: 73 77 81   Resp: 18 18 16    Temp: 97.8 F (36.6 C) 98.2 F (36.8 C) 98 F (36.7 C)   TempSrc: Oral Oral    SpO2: 100% 100% 97%   Weight:    58.8 kg  Height:        Intake/Output Summary (Last 24 hours) at 07/12/2022 1158 Last data filed at 07/12/2022 1020 Gross per 24 hour  Intake 3180 ml  Output --  Net 3180 ml   Filed Weights   07/07/22 0800 07/12/22 1157  Weight: 52.2 kg 58.8 kg    Examination:  General exam: NAD Respiratory system: Clear to auscultation. Respiratory effort normal. Cardiovascular system: S1-S2, RRR, no murmurs, no pedal edema Gastrointestinal system: Thin, distended, mild TTP, hyperactive bowel sounds Central nervous system: Alert and oriented. No focal neurological deficits. Extremities: Symmetric 5 x 5 power. Skin: No rashes, lesions or ulcers Psychiatry: Judgement and insight appear normal. Mood & affect appropriate.     Data Reviewed: I have personally reviewed following labs and imaging studies  CBC: Recent Labs  Lab  07/07/22 0905 07/08/22 0451  WBC 9.5 8.0  HGB 13.5 10.2*  HCT 44.6 33.1*  MCV 85.6 85.1  PLT 454* 396   Basic Metabolic Panel: Recent Labs  Lab 07/07/22 0905 07/08/22 0451 07/12/22 0725  NA 132* 136 136  K 4.4 3.7 3.2*  CL 97* 107 106  CO2 25  23 24   GLUCOSE 103* 78 76  BUN 15 11 <5*  CREATININE 0.59 0.38* 0.45  CALCIUM 9.1 8.3* 7.7*  MG  --   --  1.5*  PHOS  --   --  3.6   GFR: Estimated Creatinine Clearance: 62.8 mL/min (by C-G formula based on SCr of 0.45 mg/dL). Liver Function Tests: Recent Labs  Lab 07/07/22 0905 07/08/22 0451  AST 22 16  ALT 13 11  ALKPHOS 51 40  BILITOT 0.5 0.7  PROT 8.7* 6.6  ALBUMIN 3.6 2.9*   Recent Labs  Lab 07/07/22 0905  LIPASE 33   No results for input(s): "AMMONIA" in the last 168 hours. Coagulation Profile: No results for input(s): "INR", "PROTIME" in the last 168 hours. Cardiac Enzymes: No results for input(s): "CKTOTAL", "CKMB", "CKMBINDEX", "TROPONINI" in the last 168 hours. BNP (last 3 results) No results for input(s): "PROBNP" in the last 8760 hours. HbA1C: No results for input(s): "HGBA1C" in the last 72 hours. CBG: Recent Labs  Lab 07/09/22 0734  GLUCAP 74   Lipid Profile: No results for input(s): "CHOL", "HDL", "LDLCALC", "TRIG", "CHOLHDL", "LDLDIRECT" in the last 72 hours. Thyroid Function Tests: No results for input(s): "TSH", "T4TOTAL", "FREET4", "T3FREE", "THYROIDAB" in the last 72 hours. Anemia Panel: No results for input(s): "VITAMINB12", "FOLATE", "FERRITIN", "TIBC", "IRON", "RETICCTPCT" in the last 72 hours. Sepsis Labs: No results for input(s): "PROCALCITON", "LATICACIDVEN" in the last 168 hours.  No results found for this or any previous visit (from the past 240 hour(s)).       Radiology Studies: DG Abd 1 View  Addendum Date: 07/12/2022   ADDENDUM REPORT: 07/12/2022 11:32 ADDENDUM: These results were called by telephone at the time of interpretation on 07/12/2022 at 11:25 am to provider [facility] , who verbally acknowledged these results. Electronically Signed   By: Norva Pavlov M.D.   On: 07/12/2022 11:32   Result Date: 07/12/2022 CLINICAL DATA:  Constipation.  Abdominal pain. EXAM: ABDOMEN - 1 VIEW COMPARISON:  Chest x-ray earlier  today; CT of the abdomen and pelvis on 07/07/2022 FINDINGS: There is significant stool burden throughout the colon. Colonic loops are distended. The appearance is similar to recent CT exam. There is paucity of bowel loops in the pelvis, consistent with numerous fluid-filled small bowel loops, better seen on prior CT exam. IMPRESSION: Significant stool burden throughout the colon. Colonic loops are significantly distended with formed stool, similar to recent CT exam. These results were called by telephone at the time of interpretation on 07/12/2022 at 11:25 am to provider [facility] , who verbally acknowledged these results. Electronically Signed: By: Norva Pavlov M.D. On: 07/12/2022 11:27   DG Chest Port 1 View  Result Date: 07/12/2022 CLINICAL DATA:  Cough. EXAM: PORTABLE CHEST 1 VIEW COMPARISON:  10/03/2015 FINDINGS: Low lung volumes. Heart size is normal. There is stable mild elevation of the RIGHT hemidiaphragm. There are patchy opacities in the lung bases bilaterally, consistent with atelectasis or early infiltrates. Early pulmonary edema remains a consideration. There is significant stool burden throughout the visualized portion of the UPPER abdomen. IMPRESSION: Bibasilar opacities, consistent with atelectasis, early infiltrates, or early pulmonary edema remains a consideration.  Significant stool burden. Electronically Signed   By: Norva Pavlov M.D.   On: 07/12/2022 10:25        Scheduled Meds:  enoxaparin (LOVENOX) injection  40 mg Subcutaneous Q24H   feeding supplement  237 mL Oral TID BM   multivitamin with minerals  1 tablet Oral Daily   pantoprazole (PROTONIX) IV  40 mg Intravenous Q12H   sodium phosphate  1 enema Rectal BID   Continuous Infusions:  sodium chloride 75 mL/hr at 07/12/22 0955   magnesium sulfate bolus IVPB     potassium chloride       LOS: 5 days     Tresa Moore, MD [facility]   If 7PM-7AM, please contact  night-coverage  07/12/2022, 11:58 AM

## 2022-07-13 DIAGNOSIS — K5902 Outlet dysfunction constipation: Secondary | ICD-10-CM | POA: Diagnosis not present

## 2022-07-13 LAB — BASIC METABOLIC PANEL
Anion gap: 6 (ref 5–15)
BUN: <5 mg/dL — ABNORMAL LOW (ref 6–20)
CO2: 25 mmol/L (ref 22–32)
Calcium: 7.9 mg/dL — ABNORMAL LOW (ref 8.9–10.3)
Chloride: 105 mmol/L (ref 98–111)
Creatinine, Ser: 0.43 mg/dL — ABNORMAL LOW (ref 0.44–1.00)
GFR, Estimated: >60 mL/min (ref >60)
Glucose, Bld: 84 mg/dL (ref 70–99)
Potassium: 3.8 mmol/L (ref 3.5–5.1)
Sodium: 136 mmol/L (ref 135–145)

## 2022-07-13 LAB — MAGNESIUM: Magnesium: 2 mg/dL (ref 1.7–2.4)

## 2022-07-13 NOTE — Progress Notes (Signed)
PROGRESS NOTE    Baljinder Raden  ZOX:[id number] DOB: [birth year] DOA: 07/07/2022 PCP: Dione Housekeeper, MD    Brief Narrative:  Jessica Stephenson is a 57 y.o. female with medical history significant of scleroderma, GERD, pulmonary fibrosis, Raynaud's phenomenon presenting with severe constipation as evident on CT abdomen and pelvis.   Patient has a longstanding history of constipation most likely secondary to scleroderma.  She was recently started on Myfortic in addition to Plaquenil for scleroderma. She was also experiencing excessive nausea limiting her p.o. intake.  Last good bowel movement was about a month ago, tried over-the-counter enemas with no success.   5/12: Vital stable.  Received multiple enemas and suppository without any success.  Unable to tolerate GoLytely-encouraged to take small sips at a time.  Might need physical disimpaction.   5/13-14: Still no significant bowel movement although making some progress with small BMs. Encouraging ongoing intake of GoLytely as tolerated. GI is on board 5/15: Spent all night having small bowel movements.  Feeling better.  Requesting second bowel of chicken noodle soup.  Can likely defer initiation of TPN at this time.   Assessment & Plan:   Principal Problem:   Constipation Active Problems:   Scleroderma (HCC)   Pulmonary fibrosis (HCC)   GERD (gastroesophageal reflux disease)   Malnutrition of moderate degree  Constipation Worsening constipation in setting of baseline constipation w/ scleroderma associated dysmotility  Noted marked constipation w/ dysmotility on CT  GI following  Plan: Continue bowel regimen Continue GoLytely Continue IVF Soft diet as limited Mobilize as much as tolerated  Scleroderma (HCC) Holding Plaquenil and Myfortic given above Followed by [facility] rheumatology If patient continues to have bowel movements can consider restarting in the next 24 to 48 hours   Pulmonary fibrosis  (HCC) Baseline pulmonary fibrosis in the setting of scleroderma Stable from respiratory standpoint Follow   GERD (gastroesophageal reflux disease) IV PPI  Moderate malnutrition RD following   DVT prophylaxis: SQ Lovenox Code Status: Full Family Communication: Son at bedside 5/15, 5/16, 5/17 Disposition Plan: Status is: Inpatient Remains inpatient appropriate because: Constipation/obstruction   Level of care: Med-Surg  Consultants:  GI  Procedures:  None  Antimicrobials: None   Subjective: Seen and examined.  Sitting up on edge of bed.  Feeling better this morning.  Seems to be tolerating soft diet Objective: Vitals:   07/12/22 0717 07/12/22 1157 07/12/22 1505 07/12/22 2019  BP: 125/74  128/80 134/70  Pulse: 81  80 75  Resp: 16  16 18   Temp: 98 F (36.7 C)  98.2 F (36.8 C) 98.1 F (36.7 C)  TempSrc:   Oral   SpO2: 97%  98% 97%  Weight:  58.8 kg    Height:        Intake/Output Summary (Last 24 hours) at 07/13/2022 1048 Last data filed at 07/13/2022 1041 Gross per 24 hour  Intake 2100 ml  Output --  Net 2100 ml   Filed Weights   07/07/22 0800 07/12/22 1157  Weight: 52.2 kg 58.8 kg    Examination:  General exam: No acute distress Respiratory system: Clear to auscultation. Respiratory effort normal. Cardiovascular system: S1-S2, RRR, no murmurs, no pedal edema Gastrointestinal system: Thin, distended, nontender, positive bowel sounds Central nervous system: Alert and oriented. No focal neurological deficits. Extremities: Symmetric 5 x 5 power. Skin: No rashes, lesions or ulcers Psychiatry: Judgement and insight appear normal. Mood & affect appropriate.     Data Reviewed: I have personally reviewed following  labs and imaging studies  CBC: Recent Labs  Lab 07/07/22 0905 07/08/22 0451  WBC 9.5 8.0  HGB 13.5 10.2*  HCT 44.6 33.1*  MCV 85.6 85.1  PLT 454* 396   Basic Metabolic Panel: Recent Labs  Lab 07/07/22 0905 07/08/22 0451  07/12/22 0725 07/13/22 0426  NA 132* 136 136 136  K 4.4 3.7 3.2* 3.8  CL 97* 107 106 105  CO2 25 23 24 25   GLUCOSE 103* 78 76 84  BUN 15 11 <5* <5*  CREATININE 0.59 0.38* 0.45 0.43*  CALCIUM 9.1 8.3* 7.7* 7.9*  MG  --   --  1.5* 2.0  PHOS  --   --  3.6  --    GFR: Estimated Creatinine Clearance: 62.8 mL/min (A) (by C-G formula based on SCr of 0.43 mg/dL (L)). Liver Function Tests: Recent Labs  Lab 07/07/22 0905 07/08/22 0451  AST 22 16  ALT 13 11  ALKPHOS 51 40  BILITOT 0.5 0.7  PROT 8.7* 6.6  ALBUMIN 3.6 2.9*   Recent Labs  Lab 07/07/22 0905  LIPASE 33   No results for input(s): "AMMONIA" in the last 168 hours. Coagulation Profile: No results for input(s): "INR", "PROTIME" in the last 168 hours. Cardiac Enzymes: No results for input(s): "CKTOTAL", "CKMB", "CKMBINDEX", "TROPONINI" in the last 168 hours. BNP (last 3 results) No results for input(s): "PROBNP" in the last 8760 hours. HbA1C: No results for input(s): "HGBA1C" in the last 72 hours. CBG: Recent Labs  Lab 07/09/22 0734  GLUCAP 74   Lipid Profile: No results for input(s): "CHOL", "HDL", "LDLCALC", "TRIG", "CHOLHDL", "LDLDIRECT" in the last 72 hours. Thyroid Function Tests: No results for input(s): "TSH", "T4TOTAL", "FREET4", "T3FREE", "THYROIDAB" in the last 72 hours. Anemia Panel: No results for input(s): "VITAMINB12", "FOLATE", "FERRITIN", "TIBC", "IRON", "RETICCTPCT" in the last 72 hours. Sepsis Labs: No results for input(s): "PROCALCITON", "LATICACIDVEN" in the last 168 hours.  No results found for this or any previous visit (from the past 240 hour(s)).       Radiology Studies: DG Abd 1 View  Addendum Date: 07/12/2022   ADDENDUM REPORT: 07/12/2022 11:32 ADDENDUM: These results were called by telephone at the time of interpretation on 07/12/2022 at 11:25 am to provider [facility] , who verbally acknowledged these results. Electronically Signed   By: Norva Pavlov M.D.   On:  07/12/2022 11:32   Result Date: 07/12/2022 CLINICAL DATA:  Constipation.  Abdominal pain. EXAM: ABDOMEN - 1 VIEW COMPARISON:  Chest x-ray earlier today; CT of the abdomen and pelvis on 07/07/2022 FINDINGS: There is significant stool burden throughout the colon. Colonic loops are distended. The appearance is similar to recent CT exam. There is paucity of bowel loops in the pelvis, consistent with numerous fluid-filled small bowel loops, better seen on prior CT exam. IMPRESSION: Significant stool burden throughout the colon. Colonic loops are significantly distended with formed stool, similar to recent CT exam. These results were called by telephone at the time of interpretation on 07/12/2022 at 11:25 am to provider [facility] , who verbally acknowledged these results. Electronically Signed: By: Norva Pavlov M.D. On: 07/12/2022 11:27   DG Chest Port 1 View  Result Date: 07/12/2022 CLINICAL DATA:  Cough. EXAM: PORTABLE CHEST 1 VIEW COMPARISON:  10/03/2015 FINDINGS: Low lung volumes. Heart size is normal. There is stable mild elevation of the RIGHT hemidiaphragm. There are patchy opacities in the lung bases bilaterally, consistent with atelectasis or early infiltrates. Early pulmonary edema remains a consideration. There is  significant stool burden throughout the visualized portion of the UPPER abdomen. IMPRESSION: Bibasilar opacities, consistent with atelectasis, early infiltrates, or early pulmonary edema remains a consideration. Significant stool burden. Electronically Signed   By: Norva Pavlov M.D.   On: 07/12/2022 10:25        Scheduled Meds:  enoxaparin (LOVENOX) injection  40 mg Subcutaneous Q24H   feeding supplement  237 mL Oral TID BM   multivitamin with minerals  1 tablet Oral Daily   pantoprazole (PROTONIX) IV  40 mg Intravenous Q12H   sodium phosphate  1 enema Rectal BID   Continuous Infusions:  sodium chloride 75 mL/hr at 07/12/22 0955     LOS: 6 days     Tresa Moore, MD [facility]   If 7PM-7AM, please contact night-coverage  07/13/2022, 10:48 AM

## 2022-07-13 NOTE — Plan of Care (Signed)

## 2022-07-13 NOTE — Progress Notes (Addendum)
GI Inpatient Follow-up Note  Subjective:  Patient seen and had several bowel movements overnight. Nausea/abdominal pain is much improved.  Scheduled Inpatient Medications:   enoxaparin (LOVENOX) injection  40 mg Subcutaneous Q24H   feeding supplement  237 mL Oral TID BM   multivitamin with minerals  1 tablet Oral Daily   pantoprazole (PROTONIX) IV  40 mg Intravenous Q12H   sodium phosphate  1 enema Rectal BID    Continuous Inpatient Infusions:    sodium chloride 75 mL/hr at 07/12/22 0955    PRN Inpatient Medications:  ondansetron **OR** ondansetron (ZOFRAN) IV  Review of Systems:  Review of Systems  Constitutional:  Negative for chills and fever.  Respiratory:  Negative for shortness of breath.   Gastrointestinal:  Negative for nausea.  Musculoskeletal:  Negative for joint pain.  Skin:  Negative for rash.      Physical Examination: BP 134/70   Pulse 75   Temp 98.1 F (36.7 C)   Resp 18   Ht 5\' 2"  (1.575 m)   Wt 58.8 kg   SpO2 97%   BMI 23.72 kg/m  Gen: NAD, alert and oriented x 4 HEENT: PEERLA, EOMI, Neck: supple, no JVD or thyromegaly Chest: No respiratory distress Abd: distended but much improved from yesterday Ext: no edema Skin: no rash or lesions noted Lymph: no LAD  Data: Lab Results  Component Value Date   WBC 8.0 07/08/2022   HGB 10.2 (L) 07/08/2022   HCT 33.1 (L) 07/08/2022   MCV 85.1 07/08/2022   PLT 396 07/08/2022   Recent Labs  Lab 07/07/22 0905 07/08/22 0451  HGB 13.5 10.2*   Lab Results  Component Value Date   NA 136 07/13/2022   K 3.8 07/13/2022   CL 105 07/13/2022   CO2 25 07/13/2022   BUN <5 (L) 07/13/2022   CREATININE 0.43 (L) 07/13/2022   Lab Results  Component Value Date   ALT 11 07/08/2022   AST 16 07/08/2022   ALKPHOS 40 07/08/2022   BILITOT 0.7 07/08/2022   No results for input(s): "APTT", "INR", "PTT" in the last 168 hours. Assessment/Plan: [name] is a 57 y.o. female   Recommendations:  -  encouraged to continue golytely - continue IV fluids - continue to move as much as possible - continue IV PPI and anti-emetics - would not recommend reglan - continue soft diet - appreciate nutrition recommendations - belly much improved today but still a significant amount of constipation, if she has similar progress today into tomorrow then potentially could be discharged on Sunday as long as she is tolerating PO intake.   Please call with any questions or concerns. Dr. Tobi Bastos will be covering this weekend if any questions.   Merlyn Lot MD, MPH [facility] GI

## 2022-07-14 ENCOUNTER — Inpatient Hospital Stay: Payer: BC Managed Care – PPO

## 2022-07-14 DIAGNOSIS — K5902 Outlet dysfunction constipation: Secondary | ICD-10-CM | POA: Diagnosis not present

## 2022-07-14 DIAGNOSIS — K5900 Constipation, unspecified: Secondary | ICD-10-CM | POA: Diagnosis not present

## 2022-07-14 MED ORDER — PEG 3350-KCL-NA BICARB-NACL 420 G PO SOLR
4000.0000 mL | Freq: Once | ORAL | Status: AC
  Administered 2022-07-14: 4000 mL via ORAL
  Filled 2022-07-14: qty 4000

## 2022-07-14 NOTE — Progress Notes (Signed)
   Wyline Mood , MD [address], [place], [place], [address] [address], [address], [place], [place], [address] Phone: [phone]  Fax: [phone]   Merlynn Wilkowski is being followed for constipation   Subjective: Pasing gas , drank half gallon golytely , did have bowel movements, abdominal distension better   Objective: Vital signs in last 24 hours: Vitals:   07/12/22 2019 07/13/22 1623 07/13/22 1930 07/14/22 0800  BP: 134/70 131/73 130/72 131/76  Pulse: 75 72 64 90  Resp: 18 20 18 18   Temp: 98.1 F (36.7 C) 98.3 F (36.8 C) 98 F (36.7 C) 98.1 F (36.7 C)  TempSrc:   Oral   SpO2: 97% 97% 96% 95%  Weight:      Height:       Weight change:   Intake/Output Summary (Last 24 hours) at 07/14/2022 1148 Last data filed at 07/14/2022 1021 Gross per 24 hour  Intake 1859.93 ml  Output --  Net 1859.93 ml     Exam:  Abdomen: distended, non tender, no guarding or rigidity. BS+   Lab Results: @LABTEST2 @ Micro Results: No results found for this or any previous visit (from the past 240 hour(s)). Studies/Results: No results found. Medications: I have reviewed the patient's current medications. Scheduled Meds:  enoxaparin (LOVENOX) injection  40 mg Subcutaneous Q24H   feeding supplement  237 mL Oral TID BM   multivitamin with minerals  1 tablet Oral Daily   pantoprazole (PROTONIX) IV  40 mg Intravenous Q12H   sodium phosphate  1 enema Rectal BID   Continuous Infusions:  sodium chloride 75 mL/hr at 07/13/22 1345   PRN Meds:.ondansetron **OR** ondansetron (ZOFRAN) IV   Assessment: Principal Problem:   Constipation Active Problems:   Scleroderma (HCC)   Pulmonary fibrosis (HCC)   GERD (gastroesophageal reflux disease)   Malnutrition of moderate degree   Joselyn Glassman 57 y.o. female being followed for severe constipation. Has responded to golytely but I feel she still has stool inside and would benefit from finishing the  entire bowel prep (only drank 1/2 so far), passing gas    LOS: 7 days   Wyline Mood, MD 07/14/2022, 11:48 AM

## 2022-07-14 NOTE — Progress Notes (Addendum)
Pt lost iv access. Hard stick. Iv team consult placed. IV team tried and pt crying for them to stop. Refusing iv at this time. In agreeance to try tomorrow.   Son up to desk and c/o IV team hurting his mother. States he would like to make a complaint. House supervisor Rob notified and stated will come up to see pt and son.  House sup rob spoke with pt and son

## 2022-07-14 NOTE — Progress Notes (Signed)
PROGRESS NOTE    Jessica Stephenson  ZOX:[id number] DOB: 1965-07-26 DOA: 07/07/2022 PCP: Dione Housekeeper, MD    Brief Narrative:  Jessica Stephenson is a 57 y.o. female with medical history significant of scleroderma, GERD, pulmonary fibrosis, Raynaud's phenomenon presenting with severe constipation as evident on CT abdomen and pelvis.   Patient has a longstanding history of constipation most likely secondary to scleroderma.  She was recently started on Myfortic in addition to Plaquenil for scleroderma. She was also experiencing excessive nausea limiting her p.o. intake.  Last good bowel movement was about a month ago, tried over-the-counter enemas with no success.   5/12: Vital stable.  Received multiple enemas and suppository without any success.  Unable to tolerate GoLytely-encouraged to take small sips at a time.  Might need physical disimpaction.   5/13-14: Still no significant bowel movement although making some progress with small BMs. Encouraging ongoing intake of GoLytely as tolerated. GI is on board 5/15: Spent all night having small bowel movements.  Feeling better.  Requesting second bowel of chicken noodle soup.  Can likely defer initiation of TPN at this time.  5/18: Feeling decent.  More gas production.   Assessment & Plan:   Principal Problem:   Constipation Active Problems:   Scleroderma (HCC)   Pulmonary fibrosis (HCC)   GERD (gastroesophageal reflux disease)   Malnutrition of moderate degree  Constipation Worsening constipation in setting of baseline constipation w/ scleroderma associated dysmotility  Noted marked constipation w/ dysmotility on CT  GI following  Plan: Continue bowel regimen Continue GoLytely as tolerated Continue IVF Soft diet as tolerated Mobilize as much as tolerated  Scleroderma (HCC) Holding Plaquenil and Myfortic given above Followed by [facility] rheumatology    Pulmonary fibrosis (HCC) Baseline pulmonary  fibrosis in the setting of scleroderma Stable from respiratory standpoint Follow   GERD (gastroesophageal reflux disease) IV PPI  Moderate malnutrition RD following   DVT prophylaxis: SQ Lovenox Code Status: Full Family Communication: Son at bedside 5/15, 5/16, 5/17, 5/18 Disposition Plan: Status is: Inpatient Remains inpatient appropriate because: Constipation/obstruction   Level of care: Med-Surg  Consultants:  GI  Procedures:  None  Antimicrobials: None   Subjective: Seen and examined.  More gas today.  Feels like bowel movements have slowed.  Has only tried liquids this morning  Objective: Vitals:   07/12/22 2019 07/13/22 1623 07/13/22 1930 07/14/22 0800  BP: 134/70 131/73 130/72 131/76  Pulse: 75 72 64 90  Resp: 18 20 18 18   Temp: 98.1 F (36.7 C) 98.3 F (36.8 C) 98 F (36.7 C) 98.1 F (36.7 C)  TempSrc:   Oral   SpO2: 97% 97% 96% 95%  Weight:      Height:        Intake/Output Summary (Last 24 hours) at 07/14/2022 1054 Last data filed at 07/14/2022 1021 Gross per 24 hour  Intake 1859.93 ml  Output --  Net 1859.93 ml   Filed Weights   07/07/22 0800 07/12/22 1157  Weight: 52.2 kg 58.8 kg    Examination:  General exam: NAD Respiratory system: Clear to auscultation. Respiratory effort normal. Cardiovascular system: S1-S2, RRR, no murmurs, no pedal edema Gastrointestinal system: Thin, distended, nontender, positive bowel sounds Central nervous system: Alert and oriented. No focal neurological deficits. Extremities: Symmetric 5 x 5 power. Skin: No rashes, lesions or ulcers Psychiatry: Judgement and insight appear normal. Mood & affect appropriate.     Data Reviewed: I have personally reviewed following labs and imaging studies  CBC: Recent Labs  Lab 07/08/22 0451  WBC 8.0  HGB 10.2*  HCT 33.1*  MCV 85.1  PLT 396   Basic Metabolic Panel: Recent Labs  Lab 07/08/22 0451 07/12/22 0725 07/13/22 0426  NA 136 136 136  K 3.7 3.2* 3.8   CL 107 106 105  CO2 23 24 25   GLUCOSE 78 76 84  BUN 11 <5* <5*  CREATININE 0.38* 0.45 0.43*  CALCIUM 8.3* 7.7* 7.9*  MG  --  1.5* 2.0  PHOS  --  3.6  --    GFR: Estimated Creatinine Clearance: 62.8 mL/min (A) (by C-G formula based on SCr of 0.43 mg/dL (L)). Liver Function Tests: Recent Labs  Lab 07/08/22 0451  AST 16  ALT 11  ALKPHOS 40  BILITOT 0.7  PROT 6.6  ALBUMIN 2.9*   No results for input(s): "LIPASE", "AMYLASE" in the last 168 hours.  No results for input(s): "AMMONIA" in the last 168 hours. Coagulation Profile: No results for input(s): "INR", "PROTIME" in the last 168 hours. Cardiac Enzymes: No results for input(s): "CKTOTAL", "CKMB", "CKMBINDEX", "TROPONINI" in the last 168 hours. BNP (last 3 results) No results for input(s): "PROBNP" in the last 8760 hours. HbA1C: No results for input(s): "HGBA1C" in the last 72 hours. CBG: Recent Labs  Lab 07/09/22 0734  GLUCAP 74   Lipid Profile: No results for input(s): "CHOL", "HDL", "LDLCALC", "TRIG", "CHOLHDL", "LDLDIRECT" in the last 72 hours. Thyroid Function Tests: No results for input(s): "TSH", "T4TOTAL", "FREET4", "T3FREE", "THYROIDAB" in the last 72 hours. Anemia Panel: No results for input(s): "VITAMINB12", "FOLATE", "FERRITIN", "TIBC", "IRON", "RETICCTPCT" in the last 72 hours. Sepsis Labs: No results for input(s): "PROCALCITON", "LATICACIDVEN" in the last 168 hours.  No results found for this or any previous visit (from the past 240 hour(s)).       Radiology Studies: No results found.      Scheduled Meds:  enoxaparin (LOVENOX) injection  40 mg Subcutaneous Q24H   feeding supplement  237 mL Oral TID BM   multivitamin with minerals  1 tablet Oral Daily   pantoprazole (PROTONIX) IV  40 mg Intravenous Q12H   polyethylene glycol-electrolytes  4,000 mL Oral Once   sodium phosphate  1 enema Rectal BID   Continuous Infusions:  sodium chloride 75 mL/hr at 07/13/22 1345     LOS: 7 days      Tresa Moore, MD [facility]   If 7PM-7AM, please contact night-coverage  07/14/2022, 10:54 AM

## 2022-07-15 DIAGNOSIS — K5902 Outlet dysfunction constipation: Secondary | ICD-10-CM | POA: Diagnosis not present

## 2022-07-15 MED ORDER — LACTULOSE 10 GM/15ML PO SOLN
20.0000 g | Freq: Two times a day (BID) | ORAL | Status: DC
  Administered 2022-07-15: 20 g via ORAL
  Filled 2022-07-15: qty 30

## 2022-07-15 MED ORDER — LIDOCAINE 5 % EX PTCH
1.0000 | MEDICATED_PATCH | CUTANEOUS | Status: DC
  Administered 2022-07-15 – 2022-07-17 (×2): 1 via TRANSDERMAL
  Filled 2022-07-15 (×3): qty 1

## 2022-07-15 MED ORDER — PANTOPRAZOLE SODIUM 40 MG PO TBEC
40.0000 mg | DELAYED_RELEASE_TABLET | Freq: Two times a day (BID) | ORAL | Status: DC
  Administered 2022-07-15 – 2022-07-17 (×5): 40 mg via ORAL
  Filled 2022-07-15 (×5): qty 1

## 2022-07-15 NOTE — Progress Notes (Signed)
PROGRESS NOTE    Jessica Stephenson  ZOX:[id number] DOB: Jan 22, 1966 DOA: 07/07/2022 PCP: Dione Housekeeper, MD    Brief Narrative:  Jessica Stephenson is a 57 y.o. female with medical history significant of scleroderma, GERD, pulmonary fibrosis, Raynaud's phenomenon presenting with severe constipation as evident on CT abdomen and pelvis.   Patient has a longstanding history of constipation most likely secondary to scleroderma.  She was recently started on Myfortic in addition to Plaquenil for scleroderma. She was also experiencing excessive nausea limiting her p.o. intake.  Last good bowel movement was about a month ago, tried over-the-counter enemas with no success.   5/12: Vital stable.  Received multiple enemas and suppository without any success.  Unable to tolerate GoLytely-encouraged to take small sips at a time.  Might need physical disimpaction.   5/13-14: Still no significant bowel movement although making some progress with small BMs. Encouraging ongoing intake of GoLytely as tolerated. GI is on board 5/15: Spent all night having small bowel movements.  Feeling better.  Requesting second bowel of chicken noodle soup.  Can likely defer initiation of TPN at this time.  5/18: Feeling decent.  More gas production. 5/19: Continues to have bowel movements.  Passing gas.  Tolerating a bit more p.o. intake.  Lost IV access.  Hard IV stick.   Assessment & Plan:   Principal Problem:   Constipation Active Problems:   Scleroderma (HCC)   Pulmonary fibrosis (HCC)   GERD (gastroesophageal reflux disease)   Malnutrition of moderate degree  Constipation Worsening constipation in setting of baseline constipation w/ scleroderma associated dysmotility  Noted marked constipation w/ dysmotility on CT  GI following  Plan: Continue bowel regimen Continue GoLytely as tolerated Continue IVF once IV access reestablished Soft diet as tolerated Mobilize as much as  tolerated  Scleroderma (HCC) Holding Plaquenil and Myfortic given above Followed by [facility] rheumatology Outpatient follow-up scheduled for 5/29    Pulmonary fibrosis (HCC) Baseline pulmonary fibrosis in the setting of scleroderma Stable from respiratory standpoint Follow   GERD (gastroesophageal reflux disease) IV PPI  Moderate malnutrition RD following   DVT prophylaxis: SQ Lovenox Code Status: Full Family Communication: Son at bedside 5/15, 5/16, 5/17, 5/18, 5/19 Disposition Plan: Status is: Inpatient Remains inpatient appropriate because: Constipation/obstruction   Level of care: Med-Surg  Consultants:  GI  Procedures:  None  Antimicrobials: None   Subjective: Seen and examined.  Feels well today.  Belly softer but remains distended.  Ate some eggs and sausage this morning.  Objective: Vitals:   07/14/22 0800 07/14/22 1941 07/15/22 0623 07/15/22 0838  BP: 131/76 137/75 121/76 129/76  Pulse: 90 78 80 76  Resp: 18 18 18 16   Temp: 98.1 F (36.7 C) 98.1 F (36.7 C)  98.4 F (36.9 C)  TempSrc:    Oral  SpO2: 95% 100% 100% 98%  Weight:      Height:        Intake/Output Summary (Last 24 hours) at 07/15/2022 1008 Last data filed at 07/15/2022 0442 Gross per 24 hour  Intake 1363.33 ml  Output --  Net 1363.33 ml   Filed Weights   07/07/22 0800 07/12/22 1157  Weight: 52.2 kg 58.8 kg    Examination:  General exam: No acute distress.  Chronically ill-appearing Respiratory system: Lungs clear.  Normal work of breathing.  Room air Cardiovascular system: S1-S2, RRR, no murmurs, no pedal edema Gastrointestinal system: Thin, distended, nontender, positive bowel sounds Central nervous system: Alert and oriented. No focal  neurological deficits. Extremities: Symmetric 5 x 5 power. Skin: No rashes, lesions or ulcers Psychiatry: Judgement and insight appear normal. Mood & affect appropriate.     Data Reviewed: I have personally reviewed following labs and  imaging studies  CBC: No results for input(s): "WBC", "NEUTROABS", "HGB", "HCT", "MCV", "PLT" in the last 168 hours.  Basic Metabolic Panel: Recent Labs  Lab 07/12/22 0725 07/13/22 0426  NA 136 136  K 3.2* 3.8  CL 106 105  CO2 24 25  GLUCOSE 76 84  BUN <5* <5*  CREATININE 0.45 0.43*  CALCIUM 7.7* 7.9*  MG 1.5* 2.0  PHOS 3.6  --    GFR: Estimated Creatinine Clearance: 62.8 mL/min (A) (by C-G formula based on SCr of 0.43 mg/dL (L)). Liver Function Tests: No results for input(s): "AST", "ALT", "ALKPHOS", "BILITOT", "PROT", "ALBUMIN" in the last 168 hours.  No results for input(s): "LIPASE", "AMYLASE" in the last 168 hours.  No results for input(s): "AMMONIA" in the last 168 hours. Coagulation Profile: No results for input(s): "INR", "PROTIME" in the last 168 hours. Cardiac Enzymes: No results for input(s): "CKTOTAL", "CKMB", "CKMBINDEX", "TROPONINI" in the last 168 hours. BNP (last 3 results) No results for input(s): "PROBNP" in the last 8760 hours. HbA1C: No results for input(s): "HGBA1C" in the last 72 hours. CBG: Recent Labs  Lab 07/09/22 0734  GLUCAP 74   Lipid Profile: No results for input(s): "CHOL", "HDL", "LDLCALC", "TRIG", "CHOLHDL", "LDLDIRECT" in the last 72 hours. Thyroid Function Tests: No results for input(s): "TSH", "T4TOTAL", "FREET4", "T3FREE", "THYROIDAB" in the last 72 hours. Anemia Panel: No results for input(s): "VITAMINB12", "FOLATE", "FERRITIN", "TIBC", "IRON", "RETICCTPCT" in the last 72 hours. Sepsis Labs: No results for input(s): "PROCALCITON", "LATICACIDVEN" in the last 168 hours.  No results found for this or any previous visit (from the past 240 hour(s)).       Radiology Studies: DG Abd 1 View  Result Date: 07/14/2022 CLINICAL DATA:  57 year old female being followed for severe constipation. EXAM: ABDOMEN - 1 VIEW COMPARISON:  Abdominal radiographs 07/12/2022 FINDINGS: Extensive stool burden throughout the colon greatest in the  descending colon. The overall stool burden has slightly decreased from 07/12/2022. Distended loops of gas-filled colon in the central abdomen have slightly increased. Posterior gas within small bowel loops. IMPRESSION: Extensive colonic stool burden, slightly decreased from 07/12/2022. Increased gaseous distention of colon in the central abdomen. Electronically Signed   By: Minerva Fester M.D.   On: 07/14/2022 18:07        Scheduled Meds:  enoxaparin (LOVENOX) injection  40 mg Subcutaneous Q24H   feeding supplement  237 mL Oral TID BM   lactulose  20 g Oral BID   lidocaine  1 patch Transdermal Q24H   multivitamin with minerals  1 tablet Oral Daily   pantoprazole  40 mg Oral BID   sodium phosphate  1 enema Rectal BID   Continuous Infusions:  sodium chloride Stopped (07/14/22 2136)     LOS: 8 days     Tresa Moore, MD [facility]   If 7PM-7AM, please contact night-coverage  07/15/2022, 10:08 AM

## 2022-07-16 ENCOUNTER — Inpatient Hospital Stay: Payer: BC Managed Care – PPO

## 2022-07-16 DIAGNOSIS — K5902 Outlet dysfunction constipation: Secondary | ICD-10-CM | POA: Diagnosis not present

## 2022-07-16 MED ORDER — RIFAXIMIN 550 MG PO TABS
550.0000 mg | ORAL_TABLET | Freq: Three times a day (TID) | ORAL | Status: DC
  Administered 2022-07-16 – 2022-07-17 (×4): 550 mg via ORAL
  Filled 2022-07-16 (×5): qty 1

## 2022-07-16 MED ORDER — BOOST PLUS PO LIQD
237.0000 mL | Freq: Three times a day (TID) | ORAL | Status: DC
  Administered 2022-07-16 – 2022-07-17 (×4): 237 mL via ORAL
  Filled 2022-07-16: qty 237

## 2022-07-16 MED ORDER — SIMETHICONE 80 MG PO CHEW: 80.0000 mg | CHEWABLE_TABLET | Freq: Four times a day (QID) | ORAL | Status: DC | PRN

## 2022-07-16 MED ORDER — PEG 3350-KCL-NA BICARB-NACL 420 G PO SOLR
4000.0000 mL | Freq: Once | ORAL | Status: AC
  Administered 2022-07-16: 4000 mL via ORAL
  Filled 2022-07-16: qty 4000

## 2022-07-16 NOTE — Progress Notes (Signed)
GI Inpatient Follow-up Note  Subjective:  Patient seen and had rough day yesterday and this morning after starting lactulose. Was tolerating eggs yesterday. Has had several bowel movements this morning and is passing gas.  Scheduled Inpatient Medications:   enoxaparin (LOVENOX) injection  40 mg Subcutaneous Q24H   lactose free nutrition  237 mL Oral TID WC   lidocaine  1 patch Transdermal Q24H   multivitamin with minerals  1 tablet Oral Daily   pantoprazole  40 mg Oral BID   polyethylene glycol-electrolytes  4,000 mL Oral Once   rifaximin  550 mg Oral Q8H    Continuous Inpatient Infusions:    sodium chloride Stopped (07/14/22 2136)    PRN Inpatient Medications:  ondansetron **OR** ondansetron (ZOFRAN) IV, simethicone  Review of Systems:  Review of Systems  Constitutional:  Negative for fever.  Respiratory:  Negative for shortness of breath.   Cardiovascular:  Negative for chest pain.  Gastrointestinal:  Positive for diarrhea, nausea and vomiting. Negative for blood in stool, constipation and melena.  Musculoskeletal:  Positive for joint pain.  Skin:  Negative for rash.  Neurological:  Negative for focal weakness.  Psychiatric/Behavioral:  Negative for substance abuse.   All other systems reviewed and are negative.     Physical Examination: BP (!) 142/85 (BP Location: Right Arm)   Pulse (!) 104   Temp 98.1 F (36.7 C)   Resp 18   Ht 5\' 2"  (1.575 m)   Wt 58.8 kg   SpO2 97%   BMI 23.72 kg/m  Gen: NAD, alert and oriented x 4 HEENT: PEERLA, EOMI, Neck: supple, no JVD or thyromegaly Chest: No respiratory distress Abd: distended with gas Ext: no edema Skin: no rash or lesions noted Lymph: no LAD  Data: Lab Results  Component Value Date   WBC 8.0 07/08/2022   HGB 10.2 (L) 07/08/2022   HCT 33.1 (L) 07/08/2022   MCV 85.1 07/08/2022   PLT 396 07/08/2022   No results for input(s): "HGB" in the last 168 hours. Lab Results  Component Value Date   NA 136  07/13/2022   K 3.8 07/13/2022   CL 105 07/13/2022   CO2 25 07/13/2022   BUN <5 (L) 07/13/2022   CREATININE 0.43 (L) 07/13/2022   Lab Results  Component Value Date   ALT 11 07/08/2022   AST 16 07/08/2022   ALKPHOS 40 07/08/2022   BILITOT 0.7 07/08/2022   No results for input(s): "APTT", "INR", "PTT" in the last 168 hours. Assessment/Plan: [name] is a 57 y.o. lady with history of scleroderma, pulmonary fibrosis, and raynaud's who is here for severe constipation as evidenced by stool on CT. Having bowel movements but now with worsening gaseous distension, which can be made worse by lactulose  Recommendations:  - encouraged to continue golytely - restart IV fluids when possible - continue to move as much as possible - anti-emetics prn - would avoid lactulose as it can cause gaseous distension - continue soft diet, cautioned not to eat if she has nausea - KUB shows some improvement in solid stool - will add simethicone for gas - will start xifaxin for likely SIBO, can likely switch to a different antibiotic as an outpatient - will need at least another day, if gas improves tomorrow and can tolerate more PO intake then will consider discharge with outpatient f/u   Merlyn Lot MD, MPH [facility] GI

## 2022-07-16 NOTE — Progress Notes (Signed)
PROGRESS NOTE    Jessica Stephenson  ONG:[id number] DOB: Apr 04, 1965 DOA: 07/07/2022 PCP: Dione Housekeeper, MD    Brief Narrative:  Jessica Stephenson is a 57 y.o. female with medical history significant of scleroderma, GERD, pulmonary fibrosis, Raynaud's phenomenon presenting with severe constipation as evident on CT abdomen and pelvis.   Patient has a longstanding history of constipation most likely secondary to scleroderma.  She was recently started on Myfortic in addition to Plaquenil for scleroderma. She was also experiencing excessive nausea limiting her p.o. intake.  Last good bowel movement was about a month ago, tried over-the-counter enemas with no success.   5/12: Vital stable.  Received multiple enemas and suppository without any success.  Unable to tolerate GoLytely-encouraged to take small sips at a time.  Might need physical disimpaction.   5/13-14: Still no significant bowel movement although making some progress with small BMs. Encouraging ongoing intake of GoLytely as tolerated. GI is on board 5/15: Spent all night having small bowel movements.  Feeling better.  Requesting second bowel of chicken noodle soup.  Can likely defer initiation of TPN at this time.  5/18: Feeling decent.  More gas production. 5/19: Continues to have bowel movements.  Passing gas.  Tolerating a bit more p.o. intake.  Lost IV access.  Hard IV stick. 5/20: Uncomfortable today.  Abdomen more distended.   Assessment & Plan:   Principal Problem:   Constipation Active Problems:   Scleroderma (HCC)   Pulmonary fibrosis (HCC)   GERD (gastroesophageal reflux disease)   Malnutrition of moderate degree  Constipation Worsening constipation in setting of baseline constipation w/ scleroderma associated dysmotility  Noted marked constipation w/ dysmotility on CT  GI following  Abdomen increasingly distended on 5/20 Plan: Repeat KUB today Continue bowel regimen Continue GoLytely  as tolerated Continue IVF once IV access reestablished Soft diet as tolerated Mobilize as much as tolerated GI follow-up  Scleroderma (HCC) Holding Plaquenil and Myfortic given above Followed by [facility] rheumatology Outpatient follow-up scheduled for 5/29    Pulmonary fibrosis (HCC) Baseline pulmonary fibrosis in the setting of scleroderma Stable from respiratory standpoint Vitals per unit protocol   GERD (gastroesophageal reflux disease) P.o. PPI  Moderate malnutrition RD following   DVT prophylaxis: SQ Lovenox Code Status: Full Family Communication: Son at bedside 5/15, 5/16, 5/17, 5/18, 5/19, 5/20 Disposition Plan: Status is: Inpatient Remains inpatient appropriate because: Constipation/obstruction   Level of care: Med-Surg  Consultants:  GI  Procedures:  None  Antimicrobials: None   Subjective: Seen and examined.  More comfortable today.  Only tolerating liquids.  Vomiting.  Belly distended  Objective: Vitals:   07/15/22 0838 07/15/22 1748 07/15/22 2101 07/15/22 2107  BP: 129/76 119/82  (!) 142/85  Pulse: 76 86  (!) 104  Resp: 16 16  18   Temp: 98.4 F (36.9 C) 98.4 F (36.9 C)  98.1 F (36.7 C)  TempSrc: Oral  Oral   SpO2: 98% 97%  97%  Weight:      Height:        Intake/Output Summary (Last 24 hours) at 07/16/2022 1014 Last data filed at 07/15/2022 1931 Gross per 24 hour  Intake 720 ml  Output --  Net 720 ml   Filed Weights   07/07/22 0800 07/12/22 1157  Weight: 52.2 kg 58.8 kg    Examination:  General exam: No acute distress.  Appears uncomfortable Respiratory system: Lungs clear.  Normal work of breathing.  Room air Cardiovascular system: S1-S2, RRR, no murmurs, no  pedal edema Gastrointestinal system: Distended, decreased bowel sounds Central nervous system: Alert and oriented. No focal neurological deficits. Extremities: Symmetric 5 x 5 power. Skin: No rashes, lesions or ulcers Psychiatry: Judgement and insight appear normal. Mood  & affect appropriate.     Data Reviewed: I have personally reviewed following labs and imaging studies  CBC: No results for input(s): "WBC", "NEUTROABS", "HGB", "HCT", "MCV", "PLT" in the last 168 hours.  Basic Metabolic Panel: Recent Labs  Lab 07/12/22 0725 07/13/22 0426  NA 136 136  K 3.2* 3.8  CL 106 105  CO2 24 25  GLUCOSE 76 84  BUN <5* <5*  CREATININE 0.45 0.43*  CALCIUM 7.7* 7.9*  MG 1.5* 2.0  PHOS 3.6  --    GFR: Estimated Creatinine Clearance: 62.8 mL/min (A) (by C-G formula based on SCr of 0.43 mg/dL (L)). Liver Function Tests: No results for input(s): "AST", "ALT", "ALKPHOS", "BILITOT", "PROT", "ALBUMIN" in the last 168 hours.  No results for input(s): "LIPASE", "AMYLASE" in the last 168 hours.  No results for input(s): "AMMONIA" in the last 168 hours. Coagulation Profile: No results for input(s): "INR", "PROTIME" in the last 168 hours. Cardiac Enzymes: No results for input(s): "CKTOTAL", "CKMB", "CKMBINDEX", "TROPONINI" in the last 168 hours. BNP (last 3 results) No results for input(s): "PROBNP" in the last 8760 hours. HbA1C: No results for input(s): "HGBA1C" in the last 72 hours. CBG: No results for input(s): "GLUCAP" in the last 168 hours.  Lipid Profile: No results for input(s): "CHOL", "HDL", "LDLCALC", "TRIG", "CHOLHDL", "LDLDIRECT" in the last 72 hours. Thyroid Function Tests: No results for input(s): "TSH", "T4TOTAL", "FREET4", "T3FREE", "THYROIDAB" in the last 72 hours. Anemia Panel: No results for input(s): "VITAMINB12", "FOLATE", "FERRITIN", "TIBC", "IRON", "RETICCTPCT" in the last 72 hours. Sepsis Labs: No results for input(s): "PROCALCITON", "LATICACIDVEN" in the last 168 hours.  No results found for this or any previous visit (from the past 240 hour(s)).       Radiology Studies: DG Abd 1 View  Result Date: 07/14/2022 CLINICAL DATA:  57 year old female being followed for severe constipation. EXAM: ABDOMEN - 1 VIEW COMPARISON:   Abdominal radiographs 07/12/2022 FINDINGS: Extensive stool burden throughout the colon greatest in the descending colon. The overall stool burden has slightly decreased from 07/12/2022. Distended loops of gas-filled colon in the central abdomen have slightly increased. Posterior gas within small bowel loops. IMPRESSION: Extensive colonic stool burden, slightly decreased from 07/12/2022. Increased gaseous distention of colon in the central abdomen. Electronically Signed   By: Minerva Fester M.D.   On: 07/14/2022 18:07        Scheduled Meds:  enoxaparin (LOVENOX) injection  40 mg Subcutaneous Q24H   feeding supplement  237 mL Oral TID BM   lidocaine  1 patch Transdermal Q24H   multivitamin with minerals  1 tablet Oral Daily   pantoprazole  40 mg Oral BID   polyethylene glycol-electrolytes  4,000 mL Oral Once   sodium phosphate  1 enema Rectal BID   Continuous Infusions:  sodium chloride Stopped (07/14/22 2136)     LOS: 9 days     Tresa Moore, MD [facility]   If 7PM-7AM, please contact night-coverage  07/16/2022, 10:14 AM

## 2022-07-17 LAB — CBC WITH DIFFERENTIAL/PLATELET
Abs Immature Granulocytes: 0.03 10*3/uL (ref 0.00–0.07)
Basophils Absolute: 0 10*3/uL (ref 0.0–0.1)
Basophils Relative: 1 %
Eosinophils Absolute: 0.2 10*3/uL (ref 0.0–0.5)
Eosinophils Relative: 3 %
HCT: 32.5 % — ABNORMAL LOW (ref 36.0–46.0)
Hemoglobin: 10.1 g/dL — ABNORMAL LOW (ref 12.0–15.0)
Immature Granulocytes: 1 %
Lymphocytes Relative: 19 %
Lymphs Abs: 1.3 10*3/uL (ref 0.7–4.0)
MCH: 25.8 pg — ABNORMAL LOW (ref 26.0–34.0)
MCHC: 31.1 g/dL (ref 30.0–36.0)
MCV: 82.9 fL (ref 80.0–100.0)
Monocytes Absolute: 1 10*3/uL (ref 0.1–1.0)
Monocytes Relative: 16 %
Neutro Abs: 4.1 10*3/uL (ref 1.7–7.7)
Neutrophils Relative %: 60 %
Platelets: 421 10*3/uL — ABNORMAL HIGH (ref 150–400)
RBC: 3.92 MIL/uL (ref 3.87–5.11)
RDW: 16.1 % — ABNORMAL HIGH (ref 11.5–15.5)
WBC: 6.6 10*3/uL (ref 4.0–10.5)
nRBC: 0 % (ref 0.0–0.2)

## 2022-07-17 LAB — CBC
HCT: 31 % — ABNORMAL LOW (ref 36.0–46.0)
Hemoglobin: 9.8 g/dL — ABNORMAL LOW (ref 12.0–15.0)
MCH: 26.3 pg (ref 26.0–34.0)
MCHC: 31.6 g/dL (ref 30.0–36.0)
MCV: 83.1 fL (ref 80.0–100.0)
Platelets: 449 10*3/uL — ABNORMAL HIGH (ref 150–400)
RBC: 3.73 MIL/uL — ABNORMAL LOW (ref 3.87–5.11)
RDW: 16.1 % — ABNORMAL HIGH (ref 11.5–15.5)
WBC: 7.1 10*3/uL (ref 4.0–10.5)
nRBC: 0 % (ref 0.0–0.2)

## 2022-07-17 LAB — BASIC METABOLIC PANEL
Anion gap: 5 (ref 5–15)
BUN: 6 mg/dL (ref 6–20)
CO2: 29 mmol/L (ref 22–32)
Calcium: 7.9 mg/dL — ABNORMAL LOW (ref 8.9–10.3)
Chloride: 105 mmol/L (ref 98–111)
Creatinine, Ser: 0.45 mg/dL (ref 0.44–1.00)
GFR, Estimated: >60 mL/min (ref >60)
Glucose, Bld: 83 mg/dL (ref 70–99)
Potassium: 3.2 mmol/L — ABNORMAL LOW (ref 3.5–5.1)
Sodium: 139 mmol/L (ref 135–145)

## 2022-07-17 MED ORDER — METRONIDAZOLE 250 MG PO TABS: 250.0000 mg | ORAL_TABLET | Freq: Three times a day (TID) | ORAL | 0 refills | Status: AC

## 2022-07-17 MED ORDER — SIMETHICONE 80 MG PO CHEW
80.0000 mg | CHEWABLE_TABLET | Freq: Four times a day (QID) | ORAL | Status: DC
  Administered 2022-07-17: 80 mg via ORAL
  Filled 2022-07-17: qty 1

## 2022-07-17 MED ORDER — SIMETHICONE 80 MG PO CHEW: 80.0000 mg | CHEWABLE_TABLET | Freq: Four times a day (QID) | ORAL | 0 refills | Status: AC | PRN

## 2022-07-17 NOTE — Progress Notes (Signed)
PROGRESS NOTE    Pallavi Kuzio  ZOX:[id number] DOB: 06-Sep-1965 DOA: 07/07/2022 PCP: Dione Housekeeper, MD    Brief Narrative:  Jessica Stephenson is a 57 y.o. female with medical history significant of scleroderma, GERD, pulmonary fibrosis, Raynaud's phenomenon presenting with severe constipation as evident on CT abdomen and pelvis.   Patient has a longstanding history of constipation most likely secondary to scleroderma.  She was recently started on Myfortic in addition to Plaquenil for scleroderma. She was also experiencing excessive nausea limiting her p.o. intake.  Last good bowel movement was about a month ago, tried over-the-counter enemas with no success.   5/12: Vital stable.  Received multiple enemas and suppository without any success.  Unable to tolerate GoLytely-encouraged to take small sips at a time.  Might need physical disimpaction.   5/13-14: Still no significant bowel movement although making some progress with small BMs. Encouraging ongoing intake of GoLytely as tolerated. GI is on board 5/15: Spent all night having small bowel movements.  Feeling better.  Requesting second bowel of chicken noodle soup.  Can likely defer initiation of TPN at this time.  5/18: Feeling decent.  More gas production. 5/19: Continues to have bowel movements.  Passing gas.  Tolerating a bit more p.o. intake.  Lost IV access.  Hard IV stick. 5/20: Uncomfortable today.  Abdomen more distended. 5/21: Clinically improved.  Tolerating p.o. more p.o.  Continues to have bowel movements.  Expresses hunger.   Assessment & Plan:   Principal Problem:   Constipation Active Problems:   Scleroderma (HCC)   Pulmonary fibrosis (HCC)   GERD (gastroesophageal reflux disease)   Malnutrition of moderate degree  Constipation Worsening constipation in setting of baseline constipation w/ scleroderma associated dysmotility  Noted marked constipation w/ dysmotility on CT  GI following   Abdomen increasingly distended on 5/20 Showing improvement on 5/21 Plan: Continue bowel regimen.  GoLytely as tolerated.  Monitor for BM.  Continue IVF once IV access reestablished.  Soft diet as tolerated.  Mobilize.  Simethicone.  GI follow-up.  Will defer to GI regarding discharge planning.  Discharge within the next 1 to 2 days.  Scleroderma (HCC) Holding Plaquenil and Myfortic given above Followed by [facility] rheumatology Outpatient follow-up scheduled for 5/29   Pulmonary fibrosis (HCC) Baseline pulmonary fibrosis in the setting of scleroderma Stable from respiratory standpoint Vitals per unit protocol   GERD (gastroesophageal reflux disease) Oral proton pump inhibitor  Moderate malnutrition RD following   DVT prophylaxis: SQ Lovenox Code Status: Full Family Communication: Son at bedside 5/15, 5/16, 5/17, 5/18, 5/19, 5/20 Disposition Plan: Status is: Inpatient Remains inpatient appropriate because: Constipation/obstruction   Level of care: Med-Surg  Consultants:  GI  Procedures:  None  Antimicrobials: None   Subjective: Seen and examined feels better today.  Belly less distended.  Tolerating p.o.  Objective: Vitals:   07/15/22 2107 07/16/22 1701 07/16/22 2025 07/17/22 0519  BP: (!) 142/85 133/72 132/87 118/72  Pulse: (!) 104 98 89 81  Resp: 18 18 16 18   Temp: 98.1 F (36.7 C) 97.9 F (36.6 C) 97.9 F (36.6 C) 98.1 F (36.7 C)  TempSrc:   Oral Oral  SpO2: 97% 98% 98% 97%  Weight:      Height:        Intake/Output Summary (Last 24 hours) at 07/17/2022 1415 Last data filed at 07/17/2022 1051 Gross per 24 hour  Intake 960 ml  Output --  Net 960 ml   [employer]  07/07/22 0800 07/12/22 1157  Weight: 52.2 kg 58.8 kg    Examination:  General exam: NAD.  Appears comfortable today Respiratory system: Lungs clear.  Normal work of breathing.  Room air Cardiovascular system: S1-S2, RRR, no murmurs, no pedal edema Gastrointestinal system:  Distended, decreased bowel sounds Central nervous system: Alert and oriented. No focal neurological deficits. Extremities: Symmetric 5 x 5 power. Skin: No rashes, lesions or ulcers Psychiatry: Judgement and insight appear normal. Mood & affect appropriate.     Data Reviewed: I have personally reviewed following labs and imaging studies  CBC: Recent Labs  Lab 07/17/22 0329 07/17/22 0813  WBC 7.1 6.6  NEUTROABS  --  4.1  HGB 9.8* 10.1*  HCT 31.0* 32.5*  MCV 83.1 82.9  PLT 449* 421*    Basic Metabolic Panel: Recent Labs  Lab 07/12/22 0725 07/13/22 0426 07/17/22 0813  NA 136 136 139  K 3.2* 3.8 3.2*  CL 106 105 105  CO2 24 25 29   GLUCOSE 76 84 83  BUN <5* <5* 6  CREATININE 0.45 0.43* 0.45  CALCIUM 7.7* 7.9* 7.9*  MG 1.5* 2.0  --   PHOS 3.6  --   --    GFR: Estimated Creatinine Clearance: 62.8 mL/min (by C-G formula based on SCr of 0.45 mg/dL). Liver Function Tests: No results for input(s): "AST", "ALT", "ALKPHOS", "BILITOT", "PROT", "ALBUMIN" in the last 168 hours.  No results for input(s): "LIPASE", "AMYLASE" in the last 168 hours.  No results for input(s): "AMMONIA" in the last 168 hours. Coagulation Profile: No results for input(s): "INR", "PROTIME" in the last 168 hours. Cardiac Enzymes: No results for input(s): "CKTOTAL", "CKMB", "CKMBINDEX", "TROPONINI" in the last 168 hours. BNP (last 3 results) No results for input(s): "PROBNP" in the last 8760 hours. HbA1C: No results for input(s): "HGBA1C" in the last 72 hours. CBG: No results for input(s): "GLUCAP" in the last 168 hours.  Lipid Profile: No results for input(s): "CHOL", "HDL", "LDLCALC", "TRIG", "CHOLHDL", "LDLDIRECT" in the last 72 hours. Thyroid Function Tests: No results for input(s): "TSH", "T4TOTAL", "FREET4", "T3FREE", "THYROIDAB" in the last 72 hours. Anemia Panel: No results for input(s): "VITAMINB12", "FOLATE", "FERRITIN", "TIBC", "IRON", "RETICCTPCT" in the last 72 hours. Sepsis  Labs: No results for input(s): "PROCALCITON", "LATICACIDVEN" in the last 168 hours.  No results found for this or any previous visit (from the past 240 hour(s)).       Radiology Studies: DG Abd 1 View  Result Date: 07/16/2022 CLINICAL DATA:  [id number] Constipation [id number] EXAM: ABDOMEN - 1 VIEW COMPARISON:  Radiograph 07/14/2022 FINDINGS: Moderate to large colonic stool burden, decreased in the descending colon in comparison to prior exam. There is diffuse, persistent distension of bowel, overall similar to prior exam. IMPRESSION: Moderate to large colonic stool burden, decreased in the descending colon in comparison to prior. Persistent diffuse gaseous distention of bowel, not significantly changed from prior. Electronically Signed   By: Caprice Renshaw M.D.   On: 07/16/2022 10:38        Scheduled Meds:  enoxaparin (LOVENOX) injection  40 mg Subcutaneous Q24H   lactose free nutrition  237 mL Oral TID WC   lidocaine  1 patch Transdermal Q24H   multivitamin with minerals  1 tablet Oral Daily   pantoprazole  40 mg Oral BID   rifaximin  550 mg Oral Q8H   simethicone  80 mg Oral QID   Continuous Infusions:  sodium chloride Stopped (07/14/22 2136)     LOS: 10 days  Tresa Moore, MD [facility]   If 7PM-7AM, please contact night-coverage  07/17/2022, 2:15 PM

## 2022-07-17 NOTE — Discharge Summary (Signed)
Physician Discharge Summary  Jessica Stephenson ZOX:[id number] DOB: 03/16/1965 DOA: 07/07/2022  PCP: Dione Housekeeper, MD  Admit date: 07/07/2022 Discharge date: 07/17/2022  Admitted From: Home Disposition:  Home  Recommendations for Outpatient Follow-up:  Follow up with PCP in 1-2 weeks Follow-up with GI in 1 week  Home Health: No Equipment/Devices: None  Discharge Condition: Stable CODE STATUS: Full Diet recommendation: Soft/bland  Brief/Interim Summary: Jessica Stephenson is a 57 y.o. female with medical history significant of scleroderma, GERD, pulmonary fibrosis, Raynaud's phenomenon presenting with severe constipation as evident on CT abdomen and pelvis.   Patient has a longstanding history of constipation most likely secondary to scleroderma.  She was recently started on Myfortic in addition to Plaquenil for scleroderma. She was also experiencing excessive nausea limiting her p.o. intake.  Last good bowel movement was about a month ago, tried over-the-counter enemas with no success.   5/12: Vital stable.  Received multiple enemas and suppository without any success.  Unable to tolerate GoLytely-encouraged to take small sips at a time.  Might need physical disimpaction.   5/13-14: Still no significant bowel movement although making some progress with small BMs. Encouraging ongoing intake of GoLytely as tolerated. GI is on board 5/15: Spent all night having small bowel movements.  Feeling better.  Requesting second bowel of chicken noodle soup.  Can likely defer initiation of TPN at this time.   5/18: Feeling decent.  More gas production. 5/19: Continues to have bowel movements.  Passing gas.  Tolerating a bit more p.o. intake.  Lost IV access.  Hard IV stick. 5/20: Uncomfortable today.  Abdomen more distended. 5/21: Clinically improved.  Tolerating p.o. more p.o.  Continues to have bowel movements.  Expresses hunger. Discussed with GI.  Cleared for discharge  home.  Follow-up outpatient GI 1 week.  Prescriptions for Flagyl 250 mg 3 times daily x 9 days sent per GI recommendations.    Discharge Diagnoses:  Principal Problem:   Constipation Active Problems:   Scleroderma (HCC)   Pulmonary fibrosis (HCC)   GERD (gastroesophageal reflux disease)   Malnutrition of moderate degree  Constipation Worsening constipation in setting of baseline constipation w/ scleroderma associated dysmotility  Noted marked constipation w/ dysmotility on CT  GI following  Abdomen increasingly distended on 5/20 Showing improvement on 5/21 Plan: discharge home.  Flagyl 2050 mg 3 times daily x 9 days prescribed.  Follow-up outpatient GI 1 week.   Scleroderma [facility]) Patient is followed by [facility] rheumatology.  Outpatient follow-up scheduled for 5/29.  Defer changes or additions in immunosuppressive regimen to [facility] specialists.   Pulmonary fibrosis (HCC) Baseline pulmonary fibrosis in the setting of scleroderma Stable from respiratory standpoint On room air at time of discharge   GERD (gastroesophageal reflux disease) Oral proton pump inhibitor  Discharge Instructions  Discharge Instructions     Diet general   Complete by: As directed    SOFT diet   Increase activity slowly   Complete by: As directed       Allergies as of 07/17/2022       Reactions   Lactose Intolerance (gi)         Medication List     STOP taking these medications    hydroxychloroquine 200 MG tablet Commonly known as: PLAQUENIL   mycophenolate 360 MG Tbec EC tablet Commonly known as: MYFORTIC       TAKE these medications    lansoprazole 30 MG capsule Commonly known as: PREVACID Take 30 mg by mouth  2 (two) times daily before a meal.   meloxicam 15 MG tablet Commonly known as: MOBIC Take 15 mg by mouth daily as needed for pain.   metroNIDAZOLE 250 MG tablet Commonly known as: FLAGYL Take 1 tablet (250 mg total) by mouth 3 (three) times daily for 9 days.    sildenafil 20 MG tablet Commonly known as: REVATIO Take 20 mg by mouth 2 (two) times daily.   simethicone 80 MG chewable tablet Commonly known as: MYLICON Chew 1 tablet (80 mg total) by mouth 4 (four) times daily as needed for flatulence.        Follow-up Information     Zada Finders Joycie Peek, MD. Schedule an appointment as soon as possible for a visit in 1 week(s).   Specialty: Family Medicine Contact information: [address] [place] [address] [phone]         Regis Bill, MD. Schedule an appointment as soon as possible for a visit in 1 week(s).   Specialty: Gastroenterology Contact information: [address] [place] [address] [phone]                Allergies  Allergen Reactions   Lactose Intolerance (Gi)     Consultations: GI   Procedures/Studies: DG Abd 1 View  Result Date: 07/16/2022 CLINICAL DATA:  [id number] Constipation [id number] EXAM: ABDOMEN - 1 VIEW COMPARISON:  Radiograph 07/14/2022 FINDINGS: Moderate to large colonic stool burden, decreased in the descending colon in comparison to prior exam. There is diffuse, persistent distension of bowel, overall similar to prior exam. IMPRESSION: Moderate to large colonic stool burden, decreased in the descending colon in comparison to prior. Persistent diffuse gaseous distention of bowel, not significantly changed from prior. Electronically Signed   By: Caprice Renshaw M.D.   On: 07/16/2022 10:38   DG Abd 1 View  Result Date: 07/14/2022 CLINICAL DATA:  57 year old female being followed for severe constipation. EXAM: ABDOMEN - 1 VIEW COMPARISON:  Abdominal radiographs 07/12/2022 FINDINGS: Extensive stool burden throughout the colon greatest in the descending colon. The overall stool burden has slightly decreased from 07/12/2022. Distended loops of gas-filled colon in the central abdomen have slightly increased. Posterior gas within small bowel loops. IMPRESSION: Extensive colonic stool  burden, slightly decreased from 07/12/2022. Increased gaseous distention of colon in the central abdomen. Electronically Signed   By: Minerva Fester M.D.   On: 07/14/2022 18:07   CT ABDOMEN PELVIS W CONTRAST  Addendum Date: 07/12/2022   ADDENDUM REPORT: 07/12/2022 11:33 ADDENDUM: CT exam is reviewed. Within the LOWER descending colon/proximal sigmoid colon, there is abrupt change in caliber from marked dilatation to normal on images 55 of series 2 and 60 of series 6. Findings raise the question of high-grade functional obstruction or stricture as a cause for marked distension of both large and small bowel. No mass is identified in this region. These results were called by telephone at the time of interpretation on 07/12/2022 at 11:25 am to provider Johnette Abraham, who verbally acknowledged these results. Electronically Signed   By: Norva Pavlov M.D.   On: 07/12/2022 11:33   Result Date: 07/12/2022 CLINICAL DATA:  Bowel obstruction suspected. Reported clinical history of scleroderma. EXAM: CT ABDOMEN AND PELVIS WITH CONTRAST TECHNIQUE: Multidetector CT imaging of the abdomen and pelvis was performed using the standard protocol following bolus administration of intravenous contrast. RADIATION DOSE REDUCTION: This exam was performed according to the departmental dose-optimization program which includes automated exposure control, adjustment of the mA and/or kV according to  patient size and/or use of iterative reconstruction technique. CONTRAST:  80mL OMNIPAQUE IOHEXOL 300 MG/ML  SOLN COMPARISON:  None Available. FINDINGS: Lower chest: Bronchiectasis with chronic peripheral interstitial lung disease noted in both lung bases. Features are similar to minimally progressive since CT chest of 10/21/2015. Hepatobiliary: No suspicious focal abnormality within the liver parenchyma. There is no evidence for gallstones, gallbladder wall thickening, or pericholecystic fluid. No intrahepatic or extrahepatic biliary  dilation. Pancreas: No focal mass lesion. No dilatation of the main duct. No intraparenchymal cyst. No peripancreatic edema. Spleen: No splenomegaly. No focal mass lesion. Adrenals/Urinary Tract: No adrenal nodule or mass. Kidneys unremarkable. No evidence for hydroureter. The urinary bladder appears normal for the degree of distention. Stomach/Bowel: Distal esophagus is patulous with circumferential wall thickening. Stomach is unremarkable. No gastric wall thickening. No evidence of outlet obstruction. Duodenum is normally positioned as is the ligament of Treitz. Small bowel loops in the abdomen and pelvis are fluid-filled and distended measuring on the order of 2.523.4 cm diameter. The cecum is low in the pelvis. Neither the terminal ileum nor the appendix can be discretely visualized due to the prominent diffuse small bowel and colonic distension. Cecum is dilated up to 7.2 cm diameter. Ascending colon and transverse colon are packed with fluid and stool with transverse colon measuring on the order of 4.9 cm diameter. Formed stool is visible in the splenic flexure, descending colon, and within the long redundant sigmoid colon. The distal sigmoid colon and rectum is largely free of stool. Colon is noted to be diffusely featureless with absence of haustra folds. No evidence for pneumatosis. No portal venous gas. Vascular/Lymphatic: No abdominal aortic aneurysm. No abdominal aortic atherosclerotic calcification. Portal vein, superior mesenteric vein, and splenic vein are patent. There is no gastrohepatic or hepatoduodenal ligament lymphadenopathy. No retroperitoneal or mesenteric lymphadenopathy. No pelvic sidewall lymphadenopathy. Reproductive: The uterus is unremarkable.  There is no adnexal mass. Other: No intraperitoneal free fluid. Musculoskeletal: No worrisome lytic or sclerotic osseous abnormality. IMPRESSION: 1. Diffusely patulous lower esophagus with circumferential wall thickening. Imaging features  compatible with the reported clinical history of scleroderma. 2. There is diffuse fluid-filled small bowel distension without appreciable wall thickening or pneumatosis. Colon is markedly distended throughout with formed stool seen in the splenic flexure and extending to the level of the distal sigmoid segment. Colon is also noted to be featureless without evidence for hospital fold markings. No wall thickening or pneumatosis in the colon. No distal obstructing colonic mass lesion evident by CT. General appearance is compatible with marked constipation and may be related to underlying dysmotility. 3. Features of underlying chronic interstitial lung disease. Electronically Signed: By: Kennith Center M.D. On: 07/07/2022 10:29   DG Abd 1 View  Addendum Date: 07/12/2022   ADDENDUM REPORT: 07/12/2022 11:32 ADDENDUM: These results were called by telephone at the time of interpretation on 07/12/2022 at 11:25 am to provider [facility] , who verbally acknowledged these results. Electronically Signed   By: Norva Pavlov M.D.   On: 07/12/2022 11:32   Result Date: 07/12/2022 CLINICAL DATA:  Constipation.  Abdominal pain. EXAM: ABDOMEN - 1 VIEW COMPARISON:  Chest x-ray earlier today; CT of the abdomen and pelvis on 07/07/2022 FINDINGS: There is significant stool burden throughout the colon. Colonic loops are distended. The appearance is similar to recent CT exam. There is paucity of bowel loops in the pelvis, consistent with numerous fluid-filled small bowel loops, better seen on prior CT exam. IMPRESSION: Significant stool burden throughout the colon. Colonic loops  are significantly distended with formed stool, similar to recent CT exam. These results were called by telephone at the time of interpretation on 07/12/2022 at 11:25 am to provider [facility] , who verbally acknowledged these results. Electronically Signed: By: Norva Pavlov M.D. On: 07/12/2022 11:27   DG Chest Port 1 View  Result Date:  07/12/2022 CLINICAL DATA:  Cough. EXAM: PORTABLE CHEST 1 VIEW COMPARISON:  10/03/2015 FINDINGS: Low lung volumes. Heart size is normal. There is stable mild elevation of the RIGHT hemidiaphragm. There are patchy opacities in the lung bases bilaterally, consistent with atelectasis or early infiltrates. Early pulmonary edema remains a consideration. There is significant stool burden throughout the visualized portion of the UPPER abdomen. IMPRESSION: Bibasilar opacities, consistent with atelectasis, early infiltrates, or early pulmonary edema remains a consideration. Significant stool burden. Electronically Signed   By: Norva Pavlov M.D.   On: 07/12/2022 10:25      Subjective: Seen and examined on the day of discharge.  Stable no distress.  Tolerating p.o. intake.  Stable for discharge home.  Discharge Exam: Vitals:   07/16/22 2025 07/17/22 0519  BP: 132/87 118/72  Pulse: 89 81  Resp: 16 18  Temp: 97.9 F (36.6 C) 98.1 F (36.7 C)  SpO2: 98% 97%   Vitals:   07/15/22 2107 07/16/22 1701 07/16/22 2025 07/17/22 0519  BP: (!) 142/85 133/72 132/87 118/72  Pulse: (!) 104 98 89 81  Resp: 18 18 16 18   Temp: 98.1 F (36.7 C) 97.9 F (36.6 C) 97.9 F (36.6 C) 98.1 F (36.7 C)  TempSrc:   Oral Oral  SpO2: 97% 98% 98% 97%  Weight:      Height:        General: Pt is alert, awake, not in acute distress Cardiovascular: RRR, S1/S2 +, no rubs, no gallops Respiratory: CTA bilaterally, no wheezing, no rhonchi Abdominal: Soft, distended, nontender Extremities: no edema, no cyanosis    The results of significant diagnostics from this hospitalization (including imaging, microbiology, ancillary and laboratory) are listed below for reference.     Microbiology: No results found for this or any previous visit (from the past 240 hour(s)).   Labs: BNP (last 3 results) No results for input(s): "BNP" in the last 8760 hours. Basic Metabolic Panel: Recent Labs  Lab 07/12/22 0725 07/13/22 0426  07/17/22 0813  NA 136 136 139  K 3.2* 3.8 3.2*  CL 106 105 105  CO2 24 25 29   GLUCOSE 76 84 83  BUN <5* <5* 6  CREATININE 0.45 0.43* 0.45  CALCIUM 7.7* 7.9* 7.9*  MG 1.5* 2.0  --   PHOS 3.6  --   --    Liver Function Tests: No results for input(s): "AST", "ALT", "ALKPHOS", "BILITOT", "PROT", "ALBUMIN" in the last 168 hours. No results for input(s): "LIPASE", "AMYLASE" in the last 168 hours. No results for input(s): "AMMONIA" in the last 168 hours. CBC: Recent Labs  Lab 07/17/22 0329 07/17/22 0813  WBC 7.1 6.6  NEUTROABS  --  4.1  HGB 9.8* 10.1*  HCT 31.0* 32.5*  MCV 83.1 82.9  PLT 449* 421*   Cardiac Enzymes: No results for input(s): "CKTOTAL", "CKMB", "CKMBINDEX", "TROPONINI" in the last 168 hours. BNP: Invalid input(s): "POCBNP" CBG: No results for input(s): "GLUCAP" in the last 168 hours. D-Dimer No results for input(s): "DDIMER" in the last 72 hours. Hgb A1c No results for input(s): "HGBA1C" in the last 72 hours. Lipid Profile No results for input(s): "CHOL", "HDL", "LDLCALC", "TRIG", "CHOLHDL", "LDLDIRECT" in  the last 72 hours. Thyroid function studies No results for input(s): "TSH", "T4TOTAL", "T3FREE", "THYROIDAB" in the last 72 hours.  Invalid input(s): "FREET3" Anemia work up No results for input(s): "VITAMINB12", "FOLATE", "FERRITIN", "TIBC", "IRON", "RETICCTPCT" in the last 72 hours. Urinalysis    Component Value Date/Time   COLORURINE YELLOW (A) 07/07/2022 0905   APPEARANCEUR CLEAR (A) 07/07/2022 0905   LABSPEC <1.005 (L) 07/07/2022 0905   PHURINE 5.5 07/07/2022 0905   GLUCOSEU NEGATIVE 07/07/2022 0905   HGBUR TRACE (A) 07/07/2022 0905   BILIRUBINUR NEGATIVE 07/07/2022 0905   KETONESUR 40 (A) 07/07/2022 0905   PROTEINUR NEGATIVE 07/07/2022 0905   NITRITE NEGATIVE 07/07/2022 0905   LEUKOCYTESUR NEGATIVE 07/07/2022 0905   Sepsis Labs Recent Labs  Lab 07/17/22 0329 07/17/22 0813  WBC 7.1 6.6   Microbiology No results found for this or any  previous visit (from the past 240 hour(s)).   Time coordinating discharge: Over 30 minutes  SIGNED:   Tresa Moore, MD  [facility] 07/17/2022, 3:15 PM Pager   If 7PM-7AM, please contact night-coverage

## 2022-07-17 NOTE — Plan of Care (Signed)
